# Patient Record
Sex: Male | Born: 1965 | Race: White | Hispanic: No | Smoking: Never smoker
Health system: Southern US, Community
[De-identification: ages and names within clinical notes are randomized; demographics above are authoritative.]

## PROBLEM LIST (undated history)

## (undated) DIAGNOSIS — E7800 Pure hypercholesterolemia, unspecified: Secondary | ICD-10-CM

## (undated) DIAGNOSIS — F200 Paranoid schizophrenia: Secondary | ICD-10-CM

## (undated) HISTORY — PX: TONSILLECTOMY: SUR1361

---

## 2004-04-19 ENCOUNTER — Emergency Department (HOSPITAL_COMMUNITY): Admission: EM | Admit: 2004-04-19 | Discharge: 2004-04-19 | Payer: Self-pay | Admitting: Family Medicine

## 2004-08-04 ENCOUNTER — Emergency Department (HOSPITAL_COMMUNITY): Admission: EM | Admit: 2004-08-04 | Discharge: 2004-08-04 | Payer: Self-pay | Admitting: Emergency Medicine

## 2005-08-10 ENCOUNTER — Ambulatory Visit (HOSPITAL_COMMUNITY): Admission: RE | Admit: 2005-08-10 | Discharge: 2005-08-10 | Payer: Self-pay | Admitting: Specialist

## 2007-07-10 ENCOUNTER — Ambulatory Visit: Payer: Self-pay | Admitting: Cardiology

## 2008-09-14 ENCOUNTER — Emergency Department (HOSPITAL_COMMUNITY): Admission: EM | Admit: 2008-09-14 | Discharge: 2008-09-14 | Payer: Self-pay | Admitting: Emergency Medicine

## 2009-11-04 ENCOUNTER — Ambulatory Visit (HOSPITAL_COMMUNITY): Admission: RE | Admit: 2009-11-04 | Discharge: 2009-11-04 | Payer: Self-pay | Admitting: Orthopaedic Surgery

## 2009-11-10 ENCOUNTER — Encounter: Admission: RE | Admit: 2009-11-10 | Discharge: 2010-02-08 | Payer: Self-pay | Admitting: Orthopaedic Surgery

## 2011-11-07 ENCOUNTER — Emergency Department (HOSPITAL_COMMUNITY)
Admission: EM | Admit: 2011-11-07 | Discharge: 2011-11-08 | Disposition: A | Discharge status: Home / Self Care | Payer: Self-pay | Attending: Emergency Medicine | Admitting: Emergency Medicine

## 2011-11-07 DIAGNOSIS — E7800 Pure hypercholesterolemia, unspecified: Secondary | ICD-10-CM | POA: Insufficient documentation

## 2011-11-07 DIAGNOSIS — M79609 Pain in unspecified limb: Secondary | ICD-10-CM | POA: Insufficient documentation

## 2011-11-07 DIAGNOSIS — M79606 Pain in leg, unspecified: Secondary | ICD-10-CM

## 2011-11-07 DIAGNOSIS — R52 Pain, unspecified: Secondary | ICD-10-CM

## 2011-11-07 HISTORY — DX: Pure hypercholesterolemia, unspecified: E78.00

## 2011-11-07 NOTE — ED Notes (Signed)
Left leg pain behind knee. Feels like circulation has stopped. Has sharp pains in groin area for years

## 2011-11-08 ENCOUNTER — Encounter (HOSPITAL_COMMUNITY): Payer: Self-pay | Admitting: *Deleted

## 2011-11-08 ENCOUNTER — Ambulatory Visit (HOSPITAL_COMMUNITY)
Admit: 2011-11-08 | Discharge: 2011-11-08 | Disposition: A | Discharge status: Home / Self Care | Payer: Self-pay | Attending: Emergency Medicine | Admitting: Emergency Medicine

## 2011-11-08 DIAGNOSIS — R52 Pain, unspecified: Secondary | ICD-10-CM | POA: Insufficient documentation

## 2011-11-08 DIAGNOSIS — M79609 Pain in unspecified limb: Secondary | ICD-10-CM | POA: Insufficient documentation

## 2011-11-08 MED ORDER — HYDROCODONE-ACETAMINOPHEN 5-325 MG PO TABS: 1.0000 | ORAL_TABLET | ORAL | Status: AC | PRN

## 2011-11-08 MED ORDER — HYDROCODONE-ACETAMINOPHEN 5-325 MG PO TABS
2.0000 | ORAL_TABLET | Freq: Once | ORAL | Status: AC
  Administered 2011-11-08: 2 via ORAL
  Filled 2011-11-08: qty 2

## 2011-11-08 MED ORDER — ENOXAPARIN SODIUM 150 MG/ML ~~LOC~~ SOLN
1.0000 mg/kg | Freq: Once | SUBCUTANEOUS | Status: AC
  Administered 2011-11-08: 125 mg via SUBCUTANEOUS
  Filled 2011-11-08: qty 1

## 2011-11-08 NOTE — ED Provider Notes (Signed)
11/08/11  09:48  Patient returned for Venous Img of the Left LE.  I have advised him that imaging result was negative for DVT.  Stated that he has appt with the health dept tomorrow.    Avishai Reihl L. [place], [place] 11/08/11 [phone]

## 2011-11-08 NOTE — ED Provider Notes (Addendum)
History     CSN: [id number]  Arrival date & time 11/07/11  2347   First MD Initiated Contact with Patient 11/08/11 0008      Chief Complaint  Patient presents with  . Leg Pain    (Consider location/radiation/quality/duration/timing/severity/associated sxs/prior treatment) HPI Malik Blake is a 46 y.o. male who presents to the Emergency Department complaining of pain to the calf of the left lower leg that extends to the knee which began yesterday. States the leg feels "full" and like it doesn't have any circulation. He also complains of sharp stabbing left groin pain that he has had off and on for years. He denies any recent injury, illness, extended travel, prolonged inactivity. He is taking no medicines.  No PCP  Past Medical History  Diagnosis Date  . High cholesterol     Past Surgical History  Procedure Date  . Tonsillectomy     History reviewed. No pertinent family history.  History  Substance Use Topics  . Smoking status: Never Smoker   . Smokeless tobacco: Not on file  . Alcohol Use: No      Review of Systems  Constitutional: Negative for fever.       10 Systems reviewed and are negative for acute change except as noted in the HPI.  HENT: Negative for congestion.   Eyes: Negative for discharge and redness.  Respiratory: Negative for cough and shortness of breath.   Cardiovascular: Negative for chest pain.  Gastrointestinal: Negative for vomiting and abdominal pain.  Musculoskeletal: Negative for back pain.       Left lower leg pain  Skin: Negative for rash.  Neurological: Negative for syncope, numbness and headaches.  Psychiatric/Behavioral:       No behavior change.    Allergies  Review of patient's allergies indicates no known allergies.  Home Medications   Current Outpatient Rx  Name Route Sig Dispense Refill  . BENZTROPINE MESYLATE 1 MG PO TABS Oral Take 1 mg by mouth 2 (two) times daily.    Marland Kitchen CLONAZEPAM 0.5 MG PO TABS Oral Take 0.5 mg by  mouth at bedtime as needed.    Marland Kitchen RISPERIDONE 3 MG PO TABS Oral Take 3 mg by mouth daily.      BP 131/83  Pulse 90  Temp(Src) 98.6 F (37 C) (Oral)  Resp 20  Ht 6\' 1"  (1.854 m)  Wt 270 lb (122.471 kg)  BMI 35.62 kg/m2  SpO2 95%  Physical Exam  Nursing note and vitals reviewed. Constitutional:       Awake, alert, nontoxic appearance.  HENT:  Head: Atraumatic.  Eyes: Right eye exhibits no discharge. Left eye exhibits no discharge.  Neck: Neck supple.  Pulmonary/Chest: Effort normal. He exhibits no tenderness.  Abdominal: Soft. There is no tenderness. There is no rebound.  Musculoskeletal: He exhibits no tenderness.       Baseline ROM, no obvious new focal weakness. There is no appreciable difference in the circumference of his left leg compared to his right leg. There is no tenderness to palpation of the left calf. Negative Homans sign. Leg is warm. Dorsalis pedis posterior tibialis and popliteal pulses are all palpable.  Neurological:       Mental status and motor strength appears baseline for patient and situation.  Skin: No rash noted.  Psychiatric: He has a normal mood and affect.    ED Course  Procedures (including critical care time)     MDM  Patient with pain to the left lower extremity. Physical  exam is unremarkable. Patient was given Lovenox and analgesic. He has been scheduled for ultrasound tomorrow morning at 9:30 AM to rule out DVT. Patient and his mother have been given instructions on followup for ultrasound.Pt stable in ED with no significant deterioration in condition.The patient appears reasonably screened and/or stabilized for discharge and I doubt any other medical condition or other [facility] requiring further screening, evaluation, or treatment in the ED at this time prior to discharge.  MDM Reviewed: nursing note and vitals           Nicoletta Dress. Colon Branch, MD 11/08/11 0113  Nicoletta Dress. Colon Branch, MD 11/09/11 2125

## 2011-11-08 NOTE — Discharge Instructions (Signed)
You have been scheduled for an ultrasound of your leg tomorrow morning at 9:30 AM to look for blood clots. Be in the radiology department at 9:15 AM.  Once her ultrasound is complete he will be brought to the ER waiting room where the physician on duty will review the results with you. Use the pain medicine as needed.

## 2011-11-09 NOTE — ED Provider Notes (Signed)
Medical screening examination/treatment/procedure(s) were conducted as a shared visit with non-physician practitioner(s) and myself.  I personally evaluated the patient during the encounter  Nicoletta Dress. Colon Branch, MD 11/09/11 2125

## 2013-08-19 ENCOUNTER — Emergency Department (HOSPITAL_COMMUNITY)
Admission: EM | Admit: 2013-08-19 | Discharge: 2013-08-20 | Disposition: A | Discharge status: Home / Self Care | Payer: Self-pay | Attending: Emergency Medicine | Admitting: Emergency Medicine

## 2013-08-19 ENCOUNTER — Encounter (HOSPITAL_COMMUNITY): Payer: Self-pay | Admitting: Emergency Medicine

## 2013-08-19 DIAGNOSIS — Z862 Personal history of diseases of the blood and blood-forming organs and certain disorders involving the immune mechanism: Secondary | ICD-10-CM | POA: Insufficient documentation

## 2013-08-19 DIAGNOSIS — R209 Unspecified disturbances of skin sensation: Secondary | ICD-10-CM | POA: Insufficient documentation

## 2013-08-19 DIAGNOSIS — Z8639 Personal history of other endocrine, nutritional and metabolic disease: Secondary | ICD-10-CM | POA: Insufficient documentation

## 2013-08-19 DIAGNOSIS — M549 Dorsalgia, unspecified: Secondary | ICD-10-CM

## 2013-08-19 DIAGNOSIS — Z79899 Other long term (current) drug therapy: Secondary | ICD-10-CM | POA: Insufficient documentation

## 2013-08-19 DIAGNOSIS — M546 Pain in thoracic spine: Secondary | ICD-10-CM | POA: Insufficient documentation

## 2013-08-19 DIAGNOSIS — M545 Low back pain, unspecified: Secondary | ICD-10-CM | POA: Insufficient documentation

## 2013-08-19 NOTE — ED Notes (Signed)
Pt c/o back pain with spasms since last night. Pt denies any injury.

## 2013-08-20 ENCOUNTER — Emergency Department (HOSPITAL_COMMUNITY): Payer: Self-pay

## 2013-08-20 MED ORDER — HYDROCODONE-ACETAMINOPHEN 5-325 MG PO TABS: 1.0000 | ORAL_TABLET | ORAL | Status: DC | PRN

## 2013-08-20 MED ORDER — METHOCARBAMOL 500 MG PO TABS: 500.0000 mg | ORAL_TABLET | Freq: Three times a day (TID) | ORAL | Status: DC

## 2013-08-20 MED ORDER — KETOROLAC TROMETHAMINE 10 MG PO TABS
10.0000 mg | ORAL_TABLET | Freq: Once | ORAL | Status: AC
  Administered 2013-08-20: 10 mg via ORAL
  Filled 2013-08-20: qty 1

## 2013-08-20 MED ORDER — ONDANSETRON HCL 4 MG PO TABS
4.0000 mg | ORAL_TABLET | Freq: Once | ORAL | Status: AC
  Administered 2013-08-20: 4 mg via ORAL
  Filled 2013-08-20: qty 1

## 2013-08-20 MED ORDER — METHOCARBAMOL 500 MG PO TABS
1000.0000 mg | ORAL_TABLET | Freq: Once | ORAL | Status: AC
  Administered 2013-08-20: 1000 mg via ORAL
  Filled 2013-08-20: qty 2

## 2013-08-20 MED ORDER — HYDROCODONE-ACETAMINOPHEN 5-325 MG PO TABS
2.0000 | ORAL_TABLET | Freq: Once | ORAL | Status: DC
  Filled 2013-08-20: qty 2

## 2013-08-20 NOTE — Discharge Instructions (Signed)
Your x-ray is negative for any pneumonia, mass, or extra fluid on the lung. I suspect the tingling sensation is related to the degenerative changes of your thoracic area spine. Please see your primary physician or call the orthopedist for additional evaluation of the pins and needle sensation your experiencing. Please use Robaxin, ibuprofen, and Norco.

## 2013-08-20 NOTE — ED Provider Notes (Signed)
CSN: [id number]     Arrival date & time 08/19/13  2017 History   First MD Initiated Contact with Patient 08/19/13 2324     Chief Complaint  Patient presents with  . Back Pain     (Consider location/radiation/quality/duration/timing/severity/associated sxs/prior Treatment) HPI Comments: Pt report not resting well last night. He notes sensation of pins and needles in the upper back area. He has cough and nasal congestion. The cough makes the pain worse.  Patient is a 48 y.o. male presenting with back pain. The history is provided by the patient.  Back Pain Location:  Thoracic spine and lumbar spine Quality:  Cramping Radiates to:  L shoulder and R shoulder Pain severity:  Moderate Pain is:  Same all the time Onset quality:  Sudden Duration:  3 days Timing:  Intermittent Progression:  Worsening Chronicity:  Recurrent Context: not falling, not occupational injury and not recent injury   Relieved by:  Nothing Worsened by:  Movement Associated symptoms: no abdominal pain, no chest pain and no dysuria   Risk factors: no recent surgery     Past Medical History  Diagnosis Date  . High cholesterol    Past Surgical History  Procedure Laterality Date  . Tonsillectomy     No family history on file. History  Substance Use Topics  . Smoking status: Never Smoker   . Smokeless tobacco: Not on file  . Alcohol Use: No    Review of Systems  Constitutional: Negative for activity change.       All ROS Neg except as noted in HPI  HENT: Negative for nosebleeds.   Eyes: Negative for photophobia and discharge.  Respiratory: Negative for cough, shortness of breath and wheezing.   Cardiovascular: Negative for chest pain and palpitations.  Gastrointestinal: Negative for abdominal pain and blood in stool.  Genitourinary: Negative for dysuria, frequency and hematuria.  Musculoskeletal: Positive for back pain. Negative for arthralgias and neck pain.  Skin: Negative.   Neurological: Negative  for dizziness, seizures and speech difficulty.  Psychiatric/Behavioral: Negative for hallucinations and confusion.      Allergies  Review of patient's allergies indicates no known allergies.  Home Medications   Current Outpatient Rx  Name  Route  Sig  Dispense  Refill  . benztropine (COGENTIN) 1 MG tablet   Oral   Take 1 mg by mouth 2 (two) times daily.         . clonazePAM (KLONOPIN) 0.5 MG tablet   Oral   Take 0.5 mg by mouth at bedtime as needed.         . risperiDONE (RISPERDAL) 3 MG tablet   Oral   Take 3 mg by mouth daily.          BP 111/72  Pulse 85  Temp(Src) 97.9 F (36.6 C) (Oral)  Resp 20  Ht 6\' 1"  (1.854 m)  Wt 280 lb (127.007 kg)  BMI 36.95 kg/m2  SpO2 97% Physical Exam  Nursing note and vitals reviewed. Constitutional: He is oriented to person, place, and time. He appears well-developed and well-nourished.  Non-toxic appearance.  HENT:  Head: Normocephalic.  Right Ear: Tympanic membrane and external ear normal.  Left Ear: Tympanic membrane and external ear normal.  Eyes: EOM and lids are normal. Pupils are equal, round, and reactive to light.  Neck: Normal range of motion. Neck supple. Carotid bruit is not present.  Cardiovascular: Normal rate, regular rhythm, normal heart sounds, intact distal pulses and normal pulses.   Pulmonary/Chest: No respiratory distress.  Few scattered rhonchi present.  Abdominal: Soft. Bowel sounds are normal. There is no tenderness. There is no guarding.  Musculoskeletal: Normal range of motion.  There is discomfort to palpation of the upper back. There is no palpable step off of the thoracic area or the lumbar area. There is paraspinal area tenderness of the lumbar region.  Lymphadenopathy:       Head (right side): No submandibular adenopathy present.       Head (left side): No submandibular adenopathy present.    He has no cervical adenopathy.  Neurological: He is alert and oriented to person, place, and time. He  has normal strength. No cranial nerve deficit or sensory deficit.  Skin: Skin is warm and dry.  Psychiatric: He has a normal mood and affect. His speech is normal.    ED Course  Procedures (including critical care time) Labs Review Labs Reviewed - No data to display Imaging Review No results found.   EKG Interpretation None      MDM Patient states he has a history of degenerative disc disease of the lumbar spine. He has pain from time to time in his upper back, but in the last 3-4 days he's been having sensation of pins and needles in the upper back and at times and both arms. He has not been dropping any objects. He's not had any dizziness, palpitations, lightheadedness. There's been no loss of consciousness reported. There's been no high fever. The patient does have a problem with cough. The pain is actually aggravated by the cough.  Chest x-ray is negative for acute issue. Suspect that the patient has degenerative changes involving the thoracic spine area. Patient has been given the results of his chest x-ray. The plan at this time is for the patient to be placed on Robaxin, Tylenol, and Norco. Patient strongly advised to see his physician as soon as possible.    Final diagnoses:  None    **I have reviewed nursing notes, vital signs, and all appropriate lab and imaging results for this patient.    Kathie Dike, PA-C 08/20/13 0116

## 2013-08-20 NOTE — ED Provider Notes (Signed)
Medical screening examination/treatment/procedure(s) were performed by non-physician practitioner and as supervising physician I was immediately available for consultation/collaboration.   EKG Interpretation None       Sunnie NielsenBrian Talha Iser, MD 08/20/13 [phone]

## 2015-03-23 ENCOUNTER — Other Ambulatory Visit: Payer: Self-pay | Admitting: Physician Assistant

## 2015-03-24 LAB — COMPREHENSIVE METABOLIC PANEL
ALBUMIN: 4.5 g/dL (ref 3.6–5.1)
ALT: 58 U/L — ABNORMAL HIGH (ref 9–46)
AST: 27 U/L (ref 10–40)
Alkaline Phosphatase: 62 U/L (ref 40–115)
BUN: 9 mg/dL (ref 7–25)
CALCIUM: 9.6 mg/dL (ref 8.6–10.3)
CHLORIDE: 102 mmol/L (ref 98–110)
CO2: 27 mmol/L (ref 20–31)
Creat: 0.98 mg/dL (ref 0.60–1.35)
Glucose, Bld: 91 mg/dL (ref 65–99)
POTASSIUM: 4.4 mmol/L (ref 3.5–5.3)
Sodium: 141 mmol/L (ref 135–146)
TOTAL PROTEIN: 6.9 g/dL (ref 6.1–8.1)
Total Bilirubin: 0.6 mg/dL (ref 0.2–1.2)

## 2015-03-24 LAB — LIPID PANEL
CHOL/HDL RATIO: 5.2 ratio — AB (ref <=5.0)
CHOLESTEROL: 151 mg/dL (ref 125–200)
HDL: 29 mg/dL — ABNORMAL LOW (ref >=40)
LDL Cholesterol: 72 mg/dL (ref <130)
TRIGLYCERIDES: 251 mg/dL — AB (ref <150)
VLDL: 50 mg/dL — AB (ref <30)

## 2015-03-26 ENCOUNTER — Ambulatory Visit: Payer: Self-pay | Admitting: Physician Assistant

## 2015-03-30 ENCOUNTER — Emergency Department (HOSPITAL_COMMUNITY): Payer: Self-pay

## 2015-03-30 ENCOUNTER — Emergency Department (HOSPITAL_COMMUNITY)
Admission: EM | Admit: 2015-03-30 | Discharge: 2015-03-30 | Disposition: A | Discharge status: Home / Self Care | Payer: Self-pay | Attending: Emergency Medicine | Admitting: Emergency Medicine

## 2015-03-30 ENCOUNTER — Encounter (HOSPITAL_COMMUNITY): Payer: Self-pay | Admitting: Emergency Medicine

## 2015-03-30 DIAGNOSIS — Y9389 Activity, other specified: Secondary | ICD-10-CM | POA: Insufficient documentation

## 2015-03-30 DIAGNOSIS — Y998 Other external cause status: Secondary | ICD-10-CM | POA: Insufficient documentation

## 2015-03-30 DIAGNOSIS — M5136 Other intervertebral disc degeneration, lumbar region: Secondary | ICD-10-CM | POA: Insufficient documentation

## 2015-03-30 DIAGNOSIS — G8929 Other chronic pain: Secondary | ICD-10-CM | POA: Insufficient documentation

## 2015-03-30 DIAGNOSIS — Z79899 Other long term (current) drug therapy: Secondary | ICD-10-CM | POA: Insufficient documentation

## 2015-03-30 DIAGNOSIS — Z8639 Personal history of other endocrine, nutritional and metabolic disease: Secondary | ICD-10-CM | POA: Insufficient documentation

## 2015-03-30 DIAGNOSIS — X58XXXA Exposure to other specified factors, initial encounter: Secondary | ICD-10-CM | POA: Insufficient documentation

## 2015-03-30 DIAGNOSIS — Y9289 Other specified places as the place of occurrence of the external cause: Secondary | ICD-10-CM | POA: Insufficient documentation

## 2015-03-30 MED ORDER — IBUPROFEN 800 MG PO TABS: 800.0000 mg | ORAL_TABLET | Freq: Three times a day (TID) | ORAL | Status: DC

## 2015-03-30 MED ORDER — KETOROLAC TROMETHAMINE 10 MG PO TABS
10.0000 mg | ORAL_TABLET | Freq: Once | ORAL | Status: AC
  Administered 2015-03-30: 10 mg via ORAL
  Filled 2015-03-30: qty 1

## 2015-03-30 MED ORDER — CYCLOBENZAPRINE HCL 10 MG PO TABS: 10.0000 mg | ORAL_TABLET | Freq: Three times a day (TID) | ORAL | Status: DC

## 2015-03-30 MED ORDER — DIAZEPAM 5 MG PO TABS
10.0000 mg | ORAL_TABLET | Freq: Once | ORAL | Status: AC
  Administered 2015-03-30: 10 mg via ORAL
  Filled 2015-03-30: qty 2

## 2015-03-30 MED ORDER — HYDROMORPHONE HCL 1 MG/ML IJ SOLN
1.0000 mg | Freq: Once | INTRAMUSCULAR | Status: AC
  Administered 2015-03-30: 1 mg via INTRAMUSCULAR
  Filled 2015-03-30: qty 1

## 2015-03-30 MED ORDER — DEXAMETHASONE 4 MG PO TABS: 4.0000 mg | ORAL_TABLET | Freq: Two times a day (BID) | ORAL | Status: DC

## 2015-03-30 MED ORDER — ONDANSETRON HCL 4 MG PO TABS
4.0000 mg | ORAL_TABLET | Freq: Once | ORAL | Status: AC
  Administered 2015-03-30: 4 mg via ORAL
  Filled 2015-03-30: qty 1

## 2015-03-30 NOTE — ED Notes (Signed)
History of back pain for nine years but on Sunday morning while tieing shoe, back popped.  Rates pain 10/10.

## 2015-03-30 NOTE — Discharge Instructions (Signed)
Your CT scan suggests a bulging disc at the L4-L5 level. Please apply heat to your lower back. Please rest your back is much as possible. Please see Dr. Victorino Dike or member of his team as soon as possible for orthopedic evaluation concerning your back and your back pain. Please use medications as prescribed. Flexeril may cause drowsiness, please use this medication with caution. Degenerative Disk Disease Degenerative disk disease is a condition caused by the changes that occur in spinal disks as you grow older. Spinal disks are soft and compressible disks located between the bones of your spine (vertebrae). These disks act like shock absorbers. Degenerative disk disease can affect the whole spine. However, the neck and lower back are most commonly affected. Many changes can occur in the spinal disks with aging, such as:  The spinal disks may dry and shrink.  Small tears may occur in the tough, outer covering of the disk (annulus).  The disk space may become smaller due to loss of water.  Abnormal growths in the bone (spurs) may occur. This can put pressure on the nerve roots exiting the spinal canal, causing pain.  The spinal canal may become narrowed. RISK FACTORS   Being overweight.  Having a family history of degenerative disk disease.  Smoking.  There is increased risk if you are doing heavy lifting or have a sudden injury. SIGNS AND SYMPTOMS  Symptoms vary from person to person and may include:  Pain that varies in intensity. Some people have no pain, while others have severe pain. The location of the pain depends on the part of your backbone that is affected.  You will have neck or arm pain if a disk in the neck area is affected.  You will have pain in your back, buttocks, or legs if a disk in the lower back is affected.  Pain that becomes worse while bending, reaching up, or with twisting movements.  Pain that may start gradually and then get worse as time passes. It may also  start after a major or minor injury.  Numbness or tingling in the arms or legs. DIAGNOSIS  Your health care provider will ask you about your symptoms and about activities or habits that may cause the pain. He or she may also ask about any injuries, diseases, or treatments you have had. Your health care provider will examine you to check for the range of movement that is possible in the affected area, to check for strength in your extremities, and to check for sensation in the areas of the arms and legs supplied by different nerve roots. You may also have:   An X-ray of the spine.  Other imaging tests, such as MRI. TREATMENT  Your health care provider will advise you on the best plan for treatment. Treatment may include:  Medicines.  Rehabilitation exercises. HOME CARE INSTRUCTIONS   Follow proper lifting and walking techniques as advised by your health care provider.  Maintain good posture.  Exercise regularly as advised by your health care provider.  Perform relaxation exercises.  Change your sitting, standing, and sleeping habits as advised by your health care provider.  Change positions frequently.  Lose weight or maintain a healthy weight as advised by your health care provider.  Do not use any tobacco products, including cigarettes, chewing tobacco, or electronic cigarettes. If you need help quitting, ask your health care provider.  Wear supportive footwear.  Take medicines only as directed by your health care provider. SEEK MEDICAL CARE IF:  Your pain does not go away within 1-4 weeks.  You have significant appetite or weight loss. SEEK IMMEDIATE MEDICAL CARE IF:   Your pain is severe.  You notice weakness in your arms, hands, or legs.  You begin to lose control of your bladder or bowel movements.  You have fevers or night sweats. MAKE SURE YOU:   Understand these instructions.  Will watch your condition.  Will get help right away if you are not doing  well or get worse.   This information is not intended to replace advice given to you by your health care provider. Make sure you discuss any questions you have with your health care provider.   Document Released: 03/13/2007 Document Revised: 06/06/2014 Document Reviewed: 09/17/2013 Elsevier Interactive Patient Education [employer]2016 [employer]

## 2015-03-30 NOTE — ED Provider Notes (Signed)
CSN: [id number]     Arrival date & time 03/30/15  1243 History  By signing my name below, I, Gwenyth Ober, attest that this documentation has been prepared under the direction and in the presence of Ivery Quale, PA-C  Electronically Signed: Gwenyth Ober, ED Scribe. 03/30/2015. 2:19 PM.   Chief Complaint  Patient presents with  . Back Pain   Patient is a 49 y.o. male presenting with back pain. The history is provided by the patient. No language interpreter was used.  Back Pain Location:  Lumbar spine Quality:  Aching Radiates to:  Does not radiate Pain severity:  Moderate Pain is:  Same all the time Onset quality:  Sudden Duration:  1 day Timing:  Constant Chronicity:  Chronic Context: recent injury   Relieved by:  Being still Worsened by:  Movement Associated symptoms: no bladder incontinence and no bowel incontinence     HPI Comments: Malik Blake is a 49 y.o. male with a history of chronic back pain associated with L4/L5/S1 who presents to the Emergency Department complaining of a constant, moderate acute exacerbation of chronic lower back pain that started yesterday while bending to tie his shoes. Pt states that he was immobilized in his chair for at least 30 minutes after the injury secondary to pain. His pain improves with being still, but worsens with moving. Pt reports initial back pain started 9 years ago while he was working [employer]. He was evaluated for the back pain several years ago and had an MRI that showed bulging of L4/L5/SI. Pt denies previous back surgeries. He also denies bladder or bowel incontinence.  Past Medical History  Diagnosis Date  . High cholesterol    Past Surgical History  Procedure Laterality Date  . Tonsillectomy     History reviewed. No pertinent family history. Social History  Substance Use Topics  . Smoking status: Never Smoker   . Smokeless tobacco: None  . Alcohol Use: No    Review of Systems  Gastrointestinal: Negative  for bowel incontinence.  Genitourinary: Negative for bladder incontinence.  Musculoskeletal: Positive for back pain.  All other systems reviewed and are negative.  Allergies  Review of patient's allergies indicates no known allergies.  Home Medications   Prior to Admission medications   Medication Sig Start Date End Date Taking? Authorizing Provider  benztropine (COGENTIN) 1 MG tablet Take 1 mg by mouth 2 (two) times daily.   Yes Historical Provider, MD  HALOPERIDOL PO Take by mouth.   Yes Historical Provider, MD  HYDROcodone-acetaminophen (NORCO/VICODIN) 5-325 MG per tablet Take 1 tablet by mouth every 4 (four) hours as needed for moderate pain. 08/20/13  Yes Ivery Quale, PA-C  Omega-3 Fatty Acids (FISH OIL PO) Take 1 capsule by mouth daily.   Yes Historical Provider, MD   BP 110/70 mmHg  Pulse 95  Temp(Src) 98.1 F (36.7 C) (Oral)  Resp 18  Ht  (1.854 m)  Wt 280 lb (127.007 kg)  BMI 36.95 kg/m2  SpO2 99% Physical Exam  Constitutional: He appears well-developed and well-nourished. No distress.  HENT:  Head: Normocephalic and atraumatic.  Eyes: Conjunctivae and EOM are normal.  Neck: Neck supple. No tracheal deviation present.  Cardiovascular: Normal rate, regular rhythm and normal heart sounds.   Pulmonary/Chest: Effort normal and breath sounds normal. No respiratory distress. He has no wheezes.  Musculoskeletal:  Dorsiflexion is equal bilaterally Pt will not allow examination of the lower back because of pain Pt will not participate in straight leg raise  because of pain  Neurological:  No sensory deficits of the lower extremity Pt not cooperative for full motor exam because of pain  Skin: Skin is warm and dry.  Psychiatric: He has a normal mood and affect. His behavior is normal.  Nursing note and vitals reviewed.   ED Course  Procedures   DIAGNOSTIC STUDIES: Oxygen Saturation is 99% on RA, normal by my interpretation.    COORDINATION OF CARE: 2:21 PM  Discussed treatment plan with pt which includes CT Lumbar Spine. Pt agreed to plan.   Labs Review Labs Reviewed - No data to display  Imaging Review Dg Lumbar Spine Complete  03/30/2015  CLINICAL DATA:  Injury.  Pain . EXAM: LUMBAR SPINE - COMPLETE 4+ VIEW COMPARISON:  None. FINDINGS: Paraspinal soft tissues are normal. No acute bony abnormality identified. IMPRESSION: No acute or focal abnormality. Electronically Signed   By: Maisie Fushomas  Register   On: 03/30/2015 14:17   I have personally reviewed and evaluated these images as part of my medical decision-making.   EKG Interpretation None      MDM Patient having episodes of yelling out in pain, intramuscular pain medication will be given to the patient at this time. Pain improved with intramuscular pain medication and muscle relaxers, but not resolved.  CT scan of the lumbar spine reveals mild broad-based disc bulge at the L4-L5 area no foraminal stenosis appreciated.  I discussed the findings with the patient in terms which he understands. I've asked the patient to see Dr. Victorino DikeHewitt, or the orthopedic specialist of his choice for additional evaluation and management of his back pain.    Final diagnoses:  None    **I have reviewed nursing notes, vital signs, and all appropriate lab and imaging results for this patient.*  **I personally performed the services described in this documentation, which was scribed in my presence. The recorded information has been reviewed and is accurate.Ivery Quale*    Cindi Ghazarian, PA-C 03/30/15 1543  Samuel JesterKathleen McManus, DO 04/01/15 1432

## 2015-04-01 ENCOUNTER — Ambulatory Visit: Payer: Self-pay | Admitting: Physician Assistant

## 2015-04-07 ENCOUNTER — Ambulatory Visit: Payer: Self-pay | Admitting: Physician Assistant

## 2015-04-07 ENCOUNTER — Encounter: Payer: Self-pay | Admitting: Physician Assistant

## 2015-04-07 VITALS — BP 110/68 | HR 124 | Temp 98.1°F | Ht 72.5 in | Wt 278.6 lb

## 2015-04-07 DIAGNOSIS — E785 Hyperlipidemia, unspecified: Secondary | ICD-10-CM | POA: Insufficient documentation

## 2015-04-07 NOTE — Progress Notes (Signed)
BP 110/68 mmHg  Pulse 124  Temp(Src) 98.1 F (36.7 C)  Ht 6' 0.5" (1.842 m)  Wt 278 lb 9.6 oz (126.372 kg)  BMI 37.25 kg/m2  SpO2 96%   Subjective:    Patient ID: Malik Blake, male    DOB: 08-13-1965, 49 y.o.   MRN: [id number]  HPI: Malik FreezeBarry K Blake is a 49 y.o. male presenting on 04/07/2015 for Hyperlipidemia   HPI  Pt [facility] dr Hardie LoraBeene for evaluation of his back pain  Relevant past medical, surgical, family and social history reviewed and updated as indicated. Interim medical history since our last visit reviewed. Allergies and medications reviewed and updated.  Current outpatient prescriptions:  .  benztropine (COGENTIN) 1 MG tablet, Take 1 mg by mouth 2 (two) times daily., Disp: , Rfl:  .  cyclobenzaprine (FLEXERIL) 10 MG tablet, Take 1 tablet (10 mg total) by mouth 3 (three) times daily., Disp: 20 tablet, Rfl: 0 .  HALOPERIDOL PO, Take 3 mg by mouth at bedtime. Take one and one half tablets by mouth at bedtime, Disp: , Rfl:  .  ibuprofen (ADVIL,MOTRIN) 800 MG tablet, Take 1 tablet (800 mg total) by mouth 3 (three) times daily., Disp: 21 tablet, Rfl: 0 .  Omega-3 Fatty Acids (FISH OIL PO), Take 1 capsule by mouth daily., Disp: , Rfl:  .  simvastatin (ZOCOR) 20 MG tablet, Take 20 mg by mouth at bedtime. , Disp: , Rfl:    Review of Systems  Constitutional: Negative for fever, chills, diaphoresis, appetite change, fatigue and unexpected weight change.  HENT: Positive for dental problem and ear pain. Negative for congestion, drooling, facial swelling, hearing loss, mouth sores, sneezing, sore throat, trouble swallowing and voice change.   Eyes: Negative for pain, discharge, redness, itching and visual disturbance.  Respiratory: Positive for shortness of breath. Negative for cough, choking and wheezing.   Cardiovascular: Negative for chest pain, palpitations and leg swelling.  Gastrointestinal: Positive for constipation. Negative  for vomiting, abdominal pain, diarrhea and blood in stool.  Endocrine: Positive for polydipsia. Negative for cold intolerance and heat intolerance.  Genitourinary: Negative for dysuria, hematuria and decreased urine volume.  Musculoskeletal: Positive for back pain, arthralgias and gait problem.  Skin: Negative for rash.  Allergic/Immunologic: Negative for environmental allergies.  Neurological: Negative for seizures, syncope, light-headedness and headaches.  Hematological: Negative for adenopathy.  Psychiatric/Behavioral: Negative for suicidal ideas, dysphoric mood and agitation. The patient is not nervous/anxious.     Per HPI unless specifically indicated above     Objective:    BP 110/68 mmHg  Pulse 124  Temp(Src) 98.1 F (36.7 C)  Ht 6' 0.5" (1.842 m)  Wt 278 lb 9.6 oz (126.372 kg)  BMI 37.25 kg/m2  SpO2 96%  Wt Readings from Last 3 Encounters:  04/07/15 278 lb 9.6 oz (126.372 kg)  03/30/15 280 lb (127.007 kg)  08/19/13 280 lb (127.007 kg)    Physical Exam  Constitutional: He is oriented to person, place, and time. He appears well-developed and well-nourished.  HENT:  Head: Normocephalic and atraumatic.  Neck: Neck supple.  Cardiovascular: Normal rate and regular rhythm.   Pulmonary/Chest: Effort normal and breath sounds normal. He has no wheezes.  Abdominal:  Deferred abd exam due to back pain  Musculoskeletal: He exhibits no edema.  Lymphadenopathy:    He has no cervical adenopathy.  Neurological: He is alert and oriented to person, place, and time.  Skin: Skin is warm  and dry.  Psychiatric: He has a normal mood and affect. His behavior is normal. Thought content normal.  Vitals reviewed.   Results for orders placed or performed in visit on 03/23/15  Comprehensive metabolic panel  Result Value Ref Range   Sodium 141 135 - 146 mmol/L   Potassium 4.4 3.5 - 5.3 mmol/L   Chloride 102 98 - 110 mmol/L   CO2 27 20 - 31 mmol/L   Glucose, Bld 91 65 - 99 mg/dL   BUN  9 7 - 25 mg/dL   Creat 1.61 0.96 - 0.45 mg/dL   Total Bilirubin 0.6 0.2 - 1.2 mg/dL   Alkaline Phosphatase 62 40 - 115 U/L   AST 27 10 - 40 U/L   ALT 58 (H) 9 - 46 U/L   Total Protein 6.9 6.1 - 8.1 g/dL   Albumin 4.5 3.6 - 5.1 g/dL   Calcium 9.6 8.6 - 40.9 mg/dL  Lipid panel  Result Value Ref Range   Cholesterol 151 125 - 200 mg/dL   Triglycerides 811 (H) <150 mg/dL   HDL 29 (L) >=91 mg/dL   Total CHOL/HDL Ratio 5.2 (H) <=5.0 Ratio   VLDL 50 (H) <30 mg/dL   LDL Cholesterol 72 <478 mg/dL      Assessment & Plan:   Encounter Diagnosis  Name Primary?  . Hyperlipemia Yes     Reviewed labs with pt  Cont simvastatin. Increase fish oil to 4 daily. Cont lowfat diet F/u 3 mo. rto sooner prn

## 2015-07-08 ENCOUNTER — Ambulatory Visit: Payer: Self-pay | Admitting: Physician Assistant

## 2015-07-08 ENCOUNTER — Encounter: Payer: Self-pay | Admitting: Physician Assistant

## 2015-07-08 VITALS — BP 130/82 | HR 105 | Temp 98.2°F | Ht 72.5 in | Wt 284.0 lb

## 2015-07-08 DIAGNOSIS — G8929 Other chronic pain: Secondary | ICD-10-CM

## 2015-07-08 DIAGNOSIS — E669 Obesity, unspecified: Secondary | ICD-10-CM

## 2015-07-08 DIAGNOSIS — E785 Hyperlipidemia, unspecified: Secondary | ICD-10-CM

## 2015-07-08 DIAGNOSIS — M549 Dorsalgia, unspecified: Secondary | ICD-10-CM

## 2015-07-08 NOTE — Progress Notes (Signed)
   BP 130/82 mmHg  Pulse 105  Temp(Src) 98.2 F (36.8 C)  Ht 6' 0.5" (1.842 m)  Wt 284 lb (128.822 kg)  BMI 37.97 kg/m2  SpO2 95%   Subjective:    Patient ID: Malik Blake, Malik Blake    DOB: May 14, 1966, 50 y.o.   MRN: [id number]  HPI: Malik Blake is a 50 y.o. Malik Blake presenting on 07/08/2015 for Hyperlipidemia   HPI  -Pt states doing well.  -Pt states he is still going to [facility]   Relevant past medical, surgical, family and social history reviewed and updated as indicated. Interim medical history since our last visit reviewed. Allergies and medications reviewed and updated.  Review of Systems  Constitutional: Negative for fever, chills, diaphoresis, appetite change, fatigue and unexpected weight change.  HENT: Negative for congestion, dental problem, drooling, ear pain, facial swelling, hearing loss, mouth sores, sneezing, sore throat, trouble swallowing and voice change.   Eyes: Negative for pain, discharge, redness, itching and visual disturbance.  Respiratory: Negative for cough, choking, shortness of breath and wheezing.   Cardiovascular: Negative for chest pain, palpitations and leg swelling.  Gastrointestinal: Negative for vomiting, abdominal pain, diarrhea, constipation and blood in stool.  Endocrine: Negative for cold intolerance, heat intolerance and polydipsia.  Genitourinary: Negative for dysuria, hematuria and decreased urine volume.  Musculoskeletal: Positive for back pain and arthralgias. Negative for gait problem.  Skin: Negative for rash.  Allergic/Immunologic: Positive for environmental allergies.  Neurological: Negative for seizures, syncope, light-headedness and headaches.  Hematological: Negative for adenopathy.  Psychiatric/Behavioral: Negative for suicidal ideas, dysphoric mood and agitation. The patient is nervous/anxious.     Per HPI unless specifically indicated above     Objective:    BP 130/82 mmHg  Pulse 105  Temp(Src) 98.2 F (36.8 C)  Ht 6'  0.5" (1.842 m)  Wt 284 lb (128.822 kg)  BMI 37.97 kg/m2  SpO2 95%  Wt Readings from Last 3 Encounters:  07/08/15 284 lb (128.822 kg)  04/07/15 278 lb 9.6 oz (126.372 kg)  03/30/15 280 lb (127.007 kg)    Physical Exam  Constitutional: He is oriented to person, place, and time. He appears well-developed and well-nourished.  HENT:  Head: Normocephalic and atraumatic.  Neck: Neck supple.  Cardiovascular: Normal rate and regular rhythm.   Pulmonary/Chest: Effort normal and breath sounds normal. He has no wheezes.  Abdominal: Soft. Bowel sounds are normal. There is no hepatosplenomegaly. There is no tenderness.  Musculoskeletal: He exhibits no edema.  Lymphadenopathy:    He has no cervical adenopathy.  Neurological: He is alert and oriented to person, place, and time.  Skin: Skin is warm and dry.  Psychiatric: He has a normal mood and affect. His behavior is normal.  Vitals reviewed.       Assessment & Plan:   Encounter Diagnoses  Name Primary?  . Hyperlipidemia Yes  . Obesity, unspecified   . Chronic back pain    -Check fasting labs tomorrow- will call with results -F/u 6 mo.  RTO sooner prn

## 2015-07-09 LAB — COMPLETE METABOLIC PANEL WITH GFR
ALBUMIN: 4.5 g/dL (ref 3.6–5.1)
ALK PHOS: 61 U/L (ref 40–115)
ALT: 69 U/L — ABNORMAL HIGH (ref 9–46)
AST: 35 U/L (ref 10–40)
BILIRUBIN TOTAL: 0.6 mg/dL (ref 0.2–1.2)
BUN: 9 mg/dL (ref 7–25)
CO2: 28 mmol/L (ref 20–31)
CREATININE: 1.06 mg/dL (ref 0.60–1.35)
Calcium: 9.9 mg/dL (ref 8.6–10.3)
Chloride: 103 mmol/L (ref 98–110)
GFR, EST NON AFRICAN AMERICAN: 83 mL/min (ref >=60)
GFR, Est African American: >89 mL/min (ref >=60)
GLUCOSE: 102 mg/dL — AB (ref 65–99)
Potassium: 4.1 mmol/L (ref 3.5–5.3)
SODIUM: 141 mmol/L (ref 135–146)
Total Protein: 6.9 g/dL (ref 6.1–8.1)

## 2015-07-09 LAB — LIPID PANEL
Cholesterol: 176 mg/dL (ref 125–200)
HDL: 33 mg/dL — AB (ref >=40)
LDL Cholesterol: 106 mg/dL (ref <130)
Total CHOL/HDL Ratio: 5.3 Ratio — ABNORMAL HIGH (ref <=5.0)
Triglycerides: 183 mg/dL — ABNORMAL HIGH (ref <150)
VLDL: 37 mg/dL — ABNORMAL HIGH (ref <30)

## 2015-08-04 ENCOUNTER — Other Ambulatory Visit: Payer: Self-pay | Admitting: Physician Assistant

## 2015-08-04 MED ORDER — SIMVASTATIN 20 MG PO TABS: 20.0000 mg | ORAL_TABLET | Freq: Every day | ORAL | Status: DC

## 2015-12-29 ENCOUNTER — Other Ambulatory Visit: Payer: Self-pay

## 2015-12-29 DIAGNOSIS — E785 Hyperlipidemia, unspecified: Secondary | ICD-10-CM

## 2015-12-29 LAB — COMPLETE METABOLIC PANEL WITH GFR
ALBUMIN: 4.6 g/dL (ref 3.6–5.1)
ALK PHOS: 62 U/L (ref 40–115)
ALT: 56 U/L — AB (ref 9–46)
AST: 30 U/L (ref 10–40)
BILIRUBIN TOTAL: 0.6 mg/dL (ref 0.2–1.2)
BUN: 6 mg/dL — AB (ref 7–25)
CO2: 27 mmol/L (ref 20–31)
CREATININE: 1 mg/dL (ref 0.60–1.35)
Calcium: 10 mg/dL (ref 8.6–10.3)
Chloride: 104 mmol/L (ref 98–110)
GFR, Est African American: >89 mL/min (ref >=60)
GFR, Est Non African American: 88 mL/min (ref >=60)
GLUCOSE: 95 mg/dL (ref 65–99)
Potassium: 4.2 mmol/L (ref 3.5–5.3)
SODIUM: 141 mmol/L (ref 135–146)
TOTAL PROTEIN: 6.9 g/dL (ref 6.1–8.1)

## 2015-12-29 LAB — LIPID PANEL
Cholesterol: 161 mg/dL (ref 125–200)
HDL: 35 mg/dL — AB (ref >=40)
LDL CALC: 92 mg/dL (ref <130)
Total CHOL/HDL Ratio: 4.6 Ratio (ref <=5.0)
Triglycerides: 168 mg/dL — ABNORMAL HIGH (ref <150)
VLDL: 34 mg/dL — ABNORMAL HIGH (ref <30)

## 2016-01-05 ENCOUNTER — Ambulatory Visit: Payer: Self-pay | Admitting: Physician Assistant

## 2016-01-05 ENCOUNTER — Encounter: Payer: Self-pay | Admitting: Physician Assistant

## 2016-01-05 VITALS — BP 110/64 | HR 87 | Temp 98.1°F | Ht 72.5 in | Wt 289.5 lb

## 2016-01-05 DIAGNOSIS — E669 Obesity, unspecified: Secondary | ICD-10-CM

## 2016-01-05 DIAGNOSIS — M549 Dorsalgia, unspecified: Secondary | ICD-10-CM

## 2016-01-05 DIAGNOSIS — E785 Hyperlipidemia, unspecified: Secondary | ICD-10-CM

## 2016-01-05 DIAGNOSIS — G8929 Other chronic pain: Secondary | ICD-10-CM

## 2016-01-05 MED ORDER — SIMVASTATIN 20 MG PO TABS: 20.0000 mg | ORAL_TABLET | Freq: Every day | ORAL | 4 refills | Status: DC

## 2016-01-05 NOTE — Patient Instructions (Signed)

## 2016-01-05 NOTE — Progress Notes (Signed)
BP 110/64 (BP Location: Left Arm, Patient Position: Sitting, Cuff Size: Large)   Pulse 87   Temp 98.1 F (36.7 C)   Ht 6' 0.5" (1.842 m)   Wt 289 lb 8 oz (131.3 kg)   SpO2 98%   BMI 38.72 kg/m    Subjective:    Patient ID: Malik Blake Surita, male    DOB: [birth year], 50 y.o.   MRN: [id number]  HPI: Malik Blake Noguera is a 50 y.o. male presenting on 01/05/2016 for Hyperlipidemia   HPI   [facility].   He is doing well with no new issues  Relevant past medical, surgical, family and social history reviewed and updated as indicated. Interim medical history since our last visit reviewed. Allergies and medications reviewed and updated.   Current Outpatient Prescriptions:  .  benztropine (COGENTIN) 1 MG tablet, Take 1 mg by mouth 2 (two) times daily., Disp: , Rfl:  .  HALOPERIDOL PO, Take 3 mg by mouth at bedtime. Take one and one half tablets by mouth at bedtime, Disp: , Rfl:  .  Omega-3 Fatty Acids (FISH OIL PO), Take 3,000 mg by mouth daily. , Disp: , Rfl:  .  simvastatin (ZOCOR) 20 MG tablet, Take 1 tablet (20 mg total) by mouth at bedtime., Disp: 30 tablet, Rfl: 4   Review of Systems  Constitutional: Positive for unexpected weight change. Negative for appetite change, chills, diaphoresis, fatigue and fever.  HENT: Negative for congestion, drooling, ear pain, facial swelling, hearing loss, mouth sores, sneezing, sore throat, trouble swallowing and voice change.   Eyes: Positive for itching. Negative for pain, discharge, redness and visual disturbance.  Respiratory: Positive for shortness of breath. Negative for cough, choking and wheezing.   Cardiovascular: Negative for chest pain, palpitations and leg swelling.  Gastrointestinal: Negative for abdominal pain, blood in stool, constipation, diarrhea and vomiting.  Endocrine: Positive for polydipsia. Negative for cold intolerance and heat intolerance.  Genitourinary: Negative for decreased urine volume, dysuria and  hematuria.  Musculoskeletal: Positive for arthralgias and back pain. Negative for gait problem.  Skin: Negative for rash.  Allergic/Immunologic: Positive for environmental allergies.  Neurological: Negative for seizures, syncope, light-headedness and headaches.  Hematological: Negative for adenopathy.  Psychiatric/Behavioral: Negative for agitation, dysphoric mood and suicidal ideas. The patient is nervous/anxious.     Per HPI unless specifically indicated above     Objective:    BP 110/64 (BP Location: Left Arm, Patient Position: Sitting, Cuff Size: Large)   Pulse 87   Temp 98.1 F (36.7 C)   Ht 6' 0.5" (1.842 m)   Wt 289 lb 8 oz (131.3 kg)   SpO2 98%   BMI 38.72 kg/m   Wt Readings from Last 3 Encounters:  01/05/16 289 lb 8 oz (131.3 kg)  07/08/15 284 lb (128.8 kg)  04/07/15 278 lb 9.6 oz (126.4 kg)    Physical Exam  Constitutional: He is oriented to person, place, and time. He appears well-developed and well-nourished.  HENT:  Head: Normocephalic and atraumatic.  Neck: Neck supple.  Cardiovascular: Normal rate and regular rhythm.   Pulmonary/Chest: Effort normal and breath sounds normal. He has no wheezes.  Abdominal: Soft. Bowel sounds are normal. There is no hepatosplenomegaly. There is no tenderness.  Musculoskeletal: He exhibits no edema.  Lymphadenopathy:    He has no cervical adenopathy.  Neurological: He is alert and oriented to person, place, and time.  Skin: Skin is warm and dry.  Psychiatric: He has a  normal mood and affect. His behavior is normal.  Vitals reviewed.   Results for orders placed or performed in visit on 12/29/15  COMPLETE METABOLIC PANEL WITH GFR  Result Value Ref Range   Sodium 141 135 - 146 mmol/L   Potassium 4.2 3.5 - 5.3 mmol/L   Chloride 104 98 - 110 mmol/L   CO2 27 20 - 31 mmol/L   Glucose, Bld 95 65 - 99 mg/dL   BUN 6 (L) 7 - 25 mg/dL   Creat 1.61 0.96 - 0.45 mg/dL   Total Bilirubin 0.6 0.2 - 1.2 mg/dL   Alkaline Phosphatase 62  40 - 115 U/L   AST 30 10 - 40 U/L   ALT 56 (H) 9 - 46 U/L   Total Protein 6.9 6.1 - 8.1 g/dL   Albumin 4.6 3.6 - 5.1 g/dL   Calcium 40.9 8.6 - 81.1 mg/dL   GFR, Est African American >89 >=60 mL/min   GFR, Est Non African American 88 >=60 mL/min  Lipid Profile  Result Value Ref Range   Cholesterol 161 125 - 200 mg/dL   Triglycerides 914 (H) <150 mg/dL   HDL 35 (L) >=78 mg/dL   Total CHOL/HDL Ratio 4.6 <=5.0 Ratio   VLDL 34 (H) <30 mg/dL   LDL Cholesterol 92 <295 mg/dL      Assessment & Plan:    Encounter Diagnoses  Name Primary?  . Hyperlipidemia Yes  . Obesity, unspecified   . Chronic back pain     -reviewed labs with pt -continue current medications -counseled pt on gradually increasing activity to help chronic back pain -continue with [facility] for MH issues -f/u 6 months.  RTO sooner prn

## 2016-06-06 ENCOUNTER — Other Ambulatory Visit: Payer: Self-pay | Admitting: Physician Assistant

## 2016-06-27 ENCOUNTER — Other Ambulatory Visit: Payer: Self-pay

## 2016-06-27 DIAGNOSIS — E785 Hyperlipidemia, unspecified: Secondary | ICD-10-CM

## 2016-06-29 LAB — COMPLETE METABOLIC PANEL WITH GFR
ALBUMIN: 4.4 g/dL (ref 3.6–5.1)
ALK PHOS: 62 U/L (ref 40–115)
ALT: 65 U/L — ABNORMAL HIGH (ref 9–46)
AST: 33 U/L (ref 10–40)
BUN: 8 mg/dL (ref 7–25)
CHLORIDE: 104 mmol/L (ref 98–110)
CO2: 29 mmol/L (ref 20–31)
Calcium: 9.7 mg/dL (ref 8.6–10.3)
Creat: 1.07 mg/dL (ref 0.60–1.35)
GFR, Est African American: >89 mL/min (ref >=60)
GFR, Est Non African American: 81 mL/min (ref >=60)
GLUCOSE: 107 mg/dL — AB (ref 65–99)
POTASSIUM: 4.4 mmol/L (ref 3.5–5.3)
SODIUM: 142 mmol/L (ref 135–146)
Total Bilirubin: 0.5 mg/dL (ref 0.2–1.2)
Total Protein: 7 g/dL (ref 6.1–8.1)

## 2016-06-29 LAB — LIPID PANEL
CHOL/HDL RATIO: 5.7 ratio — AB (ref <5.0)
CHOLESTEROL: 176 mg/dL (ref <200)
HDL: 31 mg/dL — AB (ref >40)
LDL Cholesterol: 99 mg/dL (ref <100)
Triglycerides: 230 mg/dL — ABNORMAL HIGH (ref <150)
VLDL: 46 mg/dL — ABNORMAL HIGH (ref <30)

## 2016-07-06 ENCOUNTER — Encounter: Payer: Self-pay | Admitting: Physician Assistant

## 2016-07-06 ENCOUNTER — Ambulatory Visit: Payer: Self-pay | Admitting: Physician Assistant

## 2016-07-06 VITALS — BP 112/74 | HR 85 | Temp 97.9°F | Ht 72.5 in | Wt 293.0 lb

## 2016-07-06 DIAGNOSIS — M549 Dorsalgia, unspecified: Secondary | ICD-10-CM

## 2016-07-06 DIAGNOSIS — E785 Hyperlipidemia, unspecified: Secondary | ICD-10-CM

## 2016-07-06 DIAGNOSIS — G8929 Other chronic pain: Secondary | ICD-10-CM

## 2016-07-06 DIAGNOSIS — E669 Obesity, unspecified: Secondary | ICD-10-CM

## 2016-07-06 DIAGNOSIS — Z6839 Body mass index (BMI) 39.0-39.9, adult: Secondary | ICD-10-CM

## 2016-07-06 DIAGNOSIS — Z125 Encounter for screening for malignant neoplasm of prostate: Secondary | ICD-10-CM

## 2016-07-06 DIAGNOSIS — F39 Unspecified mood [affective] disorder: Secondary | ICD-10-CM

## 2016-07-06 NOTE — Progress Notes (Signed)
BP 112/74 (BP Location: Left Arm, Patient Position: Sitting, Cuff Size: Large)   Pulse 85   Temp 97.9 F (36.6 C) (Other (Comment))   Ht 6' 0.5" (1.842 m)   Wt 293 lb (132.9 kg)   SpO2 97%   BMI 39.19 kg/m    Subjective:    Patient ID: Malik Blake, male    DOB: Oct 05, 1965, 51 y.o.   MRN: [id number]  HPI: Malik Blake is a 51 y.o. male presenting on 07/06/2016 for Hyperlipidemia   HPI   Pt still going to [facility].  States doing okay. No new problems  Relevant past medical, surgical, family and social history reviewed and updated as indicated. Interim medical history since our last visit reviewed. Allergies and medications reviewed and updated.   Current Outpatient Prescriptions:  .  benztropine (COGENTIN) 1 MG tablet, Take 1 mg by mouth 2 (two) times daily., Disp: , Rfl:  .  HALOPERIDOL PO, Take 3 mg by mouth at bedtime. Take one and one half tablets by mouth at bedtime, Disp: , Rfl:  .  Omega-3 Fatty Acids (FISH OIL PO), Take 3,000 mg by mouth daily. , Disp: , Rfl:  .  simvastatin (ZOCOR) 20 MG tablet, TAKE ONE TABLET BY MOUTH AT BEDTIME, Disp: 30 tablet, Rfl: 4   Review of Systems  Constitutional: Negative for appetite change, chills, diaphoresis, fatigue, fever and unexpected weight change.  HENT: Negative for congestion, dental problem, drooling, ear pain, facial swelling, hearing loss, mouth sores, sneezing, sore throat, trouble swallowing and voice change.   Eyes: Negative for pain, discharge, redness, itching and visual disturbance.  Respiratory: Positive for shortness of breath. Negative for cough, choking and wheezing.   Cardiovascular: Negative for chest pain, palpitations and leg swelling.  Gastrointestinal: Negative for abdominal pain, blood in stool, constipation, diarrhea and vomiting.  Endocrine: Negative for cold intolerance, heat intolerance and polydipsia.  Genitourinary: Negative for decreased urine volume, dysuria and hematuria.  Musculoskeletal:  Positive for back pain. Negative for arthralgias and gait problem.  Skin: Negative for rash.  Allergic/Immunologic: Positive for environmental allergies.  Neurological: Negative for seizures, syncope, light-headedness and headaches.  Hematological: Negative for adenopathy.  Psychiatric/Behavioral: Negative for agitation, dysphoric mood and suicidal ideas. The patient is not nervous/anxious.     Per HPI unless specifically indicated above     Objective:    BP 112/74 (BP Location: Left Arm, Patient Position: Sitting, Cuff Size: Large)   Pulse 85   Temp 97.9 F (36.6 C) (Other (Comment))   Ht 6' 0.5" (1.842 m)   Wt 293 lb (132.9 kg)   SpO2 97%   BMI 39.19 kg/m   Wt Readings from Last 3 Encounters:  07/06/16 293 lb (132.9 kg)  01/05/16 289 lb 8 oz (131.3 kg)  07/08/15 284 lb (128.8 kg)    Physical Exam  Constitutional: He is oriented to person, place, and time. He appears well-developed and well-nourished.  HENT:  Head: Normocephalic and atraumatic.  Neck: Neck supple.  Cardiovascular: Normal rate and regular rhythm.   Pulmonary/Chest: Effort normal and breath sounds normal. He has no wheezes.  Abdominal: Soft. Bowel sounds are normal. There is no hepatosplenomegaly. There is no tenderness.  Musculoskeletal: He exhibits no edema.  Lymphadenopathy:    He has no cervical adenopathy.  Neurological: He is alert and oriented to person, place, and time.  Skin: Skin is warm and dry.  Psychiatric: He has a normal mood and affect. His behavior is normal.  Vitals reviewed.  Results for orders placed or performed in visit on 06/27/16  COMPLETE METABOLIC PANEL WITH GFR  Result Value Ref Range   Sodium 142 135 - 146 mmol/L   Potassium 4.4 3.5 - 5.3 mmol/L   Chloride 104 98 - 110 mmol/L   CO2 29 20 - 31 mmol/L   Glucose, Bld 107 (H) 65 - 99 mg/dL   BUN 8 7 - 25 mg/dL   Creat 3.081.07 6.570.60 - 8.461.35 mg/dL   Total Bilirubin 0.5 0.2 - 1.2 mg/dL   Alkaline Phosphatase 62 40 - 115 U/L    AST 33 10 - 40 U/L   ALT 65 (H) 9 - 46 U/L   Total Protein 7.0 6.1 - 8.1 g/dL   Albumin 4.4 3.6 - 5.1 g/dL   Calcium 9.7 8.6 - 96.210.3 mg/dL   GFR, Est African American >89 >=60 mL/min   GFR, Est Non African American 81 >=60 mL/min  Lipid Profile  Result Value Ref Range   Cholesterol 176 <200 mg/dL   Triglycerides 952230 (H) <150 mg/dL   HDL 31 (L) >84>40 mg/dL   Total CHOL/HDL Ratio 5.7 (H) <5.0 Ratio   VLDL 46 (H) <30 mg/dL   LDL Cholesterol 99 <132<100 mg/dL      Assessment & Plan:   Encounter Diagnoses  Name Primary?  . Hyperlipidemia, unspecified hyperlipidemia type Yes  . Chronic back pain, unspecified back location, unspecified back pain laterality   . Class 2 obesity with body mass index (BMI) of 39.0 to 39.9 in adult, unspecified obesity type, unspecified whether serious comorbidity present   . Mood disorder (HCC)   . Screening for prostate cancer      -reviewed labs with pt -pt to continue current medications -counseled pt on regular exercise to help with weight and deconditioning -follow up 4 months.  RTO sooner prn

## 2016-10-26 ENCOUNTER — Other Ambulatory Visit (HOSPITAL_COMMUNITY)
Admission: RE | Admit: 2016-10-26 | Discharge: 2016-10-26 | Disposition: A | Discharge status: Home / Self Care | Payer: Self-pay | Source: Ambulatory Visit | Attending: Physician Assistant | Admitting: Physician Assistant

## 2016-10-26 LAB — COMPREHENSIVE METABOLIC PANEL WITH GFR
ALT: 65 U/L — ABNORMAL HIGH (ref 17–63)
AST: 34 U/L (ref 15–41)
Albumin: 4.2 g/dL (ref 3.5–5.0)
Alkaline Phosphatase: 63 U/L (ref 38–126)
Anion gap: 9 (ref 5–15)
BUN: 7 mg/dL (ref 6–20)
CO2: 27 mmol/L (ref 22–32)
Calcium: 9.3 mg/dL (ref 8.9–10.3)
Chloride: 104 mmol/L (ref 101–111)
Creatinine, Ser: 0.95 mg/dL (ref 0.61–1.24)
GFR calc Af Amer: >60 mL/min
GFR calc non Af Amer: >60 mL/min
Glucose, Bld: 106 mg/dL — ABNORMAL HIGH (ref 65–99)
Potassium: 3.8 mmol/L (ref 3.5–5.1)
Sodium: 140 mmol/L (ref 135–145)
Total Bilirubin: 0.6 mg/dL (ref 0.3–1.2)
Total Protein: 7.1 g/dL (ref 6.5–8.1)

## 2016-10-26 LAB — LIPID PANEL
CHOL/HDL RATIO: 4.8 ratio
Cholesterol: 168 mg/dL (ref 0–200)
HDL: 35 mg/dL — ABNORMAL LOW (ref >40)
LDL CALC: 91 mg/dL (ref 0–99)
Triglycerides: 210 mg/dL — ABNORMAL HIGH (ref <150)
VLDL: 42 mg/dL — AB (ref 0–40)

## 2016-10-26 LAB — PSA: PSA: 1.7 ng/mL (ref 0.00–4.00)

## 2016-11-03 ENCOUNTER — Encounter: Payer: Self-pay | Admitting: Physician Assistant

## 2016-11-03 ENCOUNTER — Ambulatory Visit: Payer: Self-pay | Admitting: Physician Assistant

## 2016-11-03 ENCOUNTER — Other Ambulatory Visit: Payer: Self-pay | Admitting: Physician Assistant

## 2016-11-03 VITALS — BP 116/68 | HR 87 | Temp 98.1°F | Ht 72.5 in | Wt 289.0 lb

## 2016-11-03 DIAGNOSIS — Z1211 Encounter for screening for malignant neoplasm of colon: Secondary | ICD-10-CM

## 2016-11-03 DIAGNOSIS — E785 Hyperlipidemia, unspecified: Secondary | ICD-10-CM

## 2016-11-03 DIAGNOSIS — S61002A Unspecified open wound of left thumb without damage to nail, initial encounter: Secondary | ICD-10-CM

## 2016-11-03 DIAGNOSIS — E669 Obesity, unspecified: Secondary | ICD-10-CM

## 2016-11-03 NOTE — Progress Notes (Signed)
BP 116/68 (BP Location: Left Arm, Patient Position: Sitting, Cuff Size: Large)   Pulse 87   Temp 98.1 F (36.7 C) (Other (Comment))   Ht 6' 0.5" (1.842 m)   Wt 289 lb (131.1 kg)   SpO2 97%   BMI 38.66 kg/m    Subjective:    Patient ID: Malik Blake, male    DOB: [birth year], 51 y.o.   MRN: [id number]  HPI: Malik Blake is a 51 y.o. male presenting on 11/03/2016 for Hyperlipidemia   HPI   Pt ran out of fish oil 3 days ago  He is still going to [facility]  Pt is feeling well today and has no complaints  Pt did slice his thumb a few days ago.  His Td is UTD  Relevant past medical, surgical, family and social history reviewed and updated as indicated. Interim medical history since our last visit reviewed. Allergies and medications reviewed and updated.   Current Outpatient Prescriptions:  .  benztropine (COGENTIN) 1 MG tablet, Take 1 mg by mouth 2 (two) times daily., Disp: , Rfl:  .  HALOPERIDOL PO, Take 3 mg by mouth at bedtime. Take one and one half tablets by mouth at bedtime, Disp: , Rfl:  .  simvastatin (ZOCOR) 20 MG tablet, TAKE ONE TABLET BY MOUTH AT BEDTIME, Disp: 30 tablet, Rfl: 4 .  Omega-3 Fatty Acids (FISH OIL PO), Take 3,000 mg by mouth daily. , Disp: , Rfl:    Review of Systems  Constitutional: Positive for fatigue. Negative for appetite change, chills, diaphoresis, fever and unexpected weight change.  HENT: Negative for congestion, drooling, ear pain, facial swelling, hearing loss, mouth sores, sneezing, sore throat, trouble swallowing and voice change.   Eyes: Negative for pain, discharge, redness, itching and visual disturbance.  Respiratory: Negative for cough, choking, shortness of breath and wheezing.   Cardiovascular: Negative for chest pain, palpitations and leg swelling.  Gastrointestinal: Negative for abdominal pain, blood in stool, constipation, diarrhea and vomiting.  Endocrine: Positive for polydipsia. Negative for cold intolerance and heat  intolerance.  Genitourinary: Negative for decreased urine volume, dysuria and hematuria.  Musculoskeletal: Positive for back pain and gait problem. Negative for arthralgias.  Skin: Negative for rash.  Allergic/Immunologic: Negative for environmental allergies.  Neurological: Negative for seizures, syncope, light-headedness and headaches.  Hematological: Negative for adenopathy.  Psychiatric/Behavioral: Negative for agitation, dysphoric mood and suicidal ideas. The patient is not nervous/anxious.     Per HPI unless specifically indicated above     Objective:    BP 116/68 (BP Location: Left Arm, Patient Position: Sitting, Cuff Size: Large)   Pulse 87   Temp 98.1 F (36.7 C) (Other (Comment))   Ht 6' 0.5" (1.842 m)   Wt 289 lb (131.1 kg)   SpO2 97%   BMI 38.66 kg/m   Wt Readings from Last 3 Encounters:  11/03/16 289 lb (131.1 kg)  07/06/16 293 lb (132.9 kg)  01/05/16 289 lb 8 oz (131.3 kg)    Physical Exam  Constitutional: He is oriented to person, place, and time. He appears well-developed and well-nourished.  HENT:  Head: Normocephalic and atraumatic.  Neck: Neck supple.  Cardiovascular: Normal rate and regular rhythm.   Pulmonary/Chest: Effort normal and breath sounds normal. He has no wheezes.  Abdominal: Soft. Bowel sounds are normal. There is no hepatosplenomegaly. There is no tenderness.  Musculoskeletal: He exhibits no edema.       Left hand: He exhibits laceration. He exhibits normal range of motion, no  tenderness, no bony tenderness, normal capillary refill, no deformity and no swelling.       Hands: 1 cm avulsion L thumb. Clean and appears to be healing normally. No signs infections.   Lymphadenopathy:    He has no cervical adenopathy.  Neurological: He is alert and oriented to person, place, and time.  Skin: Skin is warm and dry.  Psychiatric: He has a normal mood and affect. His behavior is normal.  Vitals reviewed.   Results for orders placed or performed  during the hospital encounter of 10/26/16  Comprehensive metabolic panel  Result Value Ref Range   Sodium 140 135 - 145 mmol/L   Potassium 3.8 3.5 - 5.1 mmol/L   Chloride 104 101 - 111 mmol/L   CO2 27 22 - 32 mmol/L   Glucose, Bld 106 (H) 65 - 99 mg/dL   BUN 7 6 - 20 mg/dL   Creatinine, Ser 1.610.95 0.61 - 1.24 mg/dL   Calcium 9.3 8.9 - 09.610.3 mg/dL   Total Protein 7.1 6.5 - 8.1 g/dL   Albumin 4.2 3.5 - 5.0 g/dL   AST 34 15 - 41 U/L   ALT 65 (H) 17 - 63 U/L   Alkaline Phosphatase 63 38 - 126 U/L   Total Bilirubin 0.6 0.3 - 1.2 mg/dL   GFR calc non Af Amer >60 >60 mL/min   GFR calc Af Amer >60 >60 mL/min   Anion gap 9 5 - 15  Lipid panel  Result Value Ref Range   Cholesterol 168 0 - 200 mg/dL   Triglycerides 045210 (H) <150 mg/dL   HDL 35 (L) >40>40 mg/dL   Total CHOL/HDL Ratio 4.8 RATIO   VLDL 42 (H) 0 - 40 mg/dL   LDL Cholesterol 91 0 - 99 mg/dL  PSA  Result Value Ref Range   PSA 1.70 0.00 - 4.00 ng/mL      Assessment & Plan:   Encounter Diagnoses  Name Primary?  . Hyperlipidemia, unspecified hyperlipidemia type Yes  . Obesity, unspecified classification, unspecified obesity type, unspecified whether serious comorbidity present   . Open wound of left thumb, initial encounter   . Screening for colon cancer      -reviewed labs with pt -Gave iFOBT for colon cancer screening -pt Thumb okay. Counseled on wound care -F/u 4 months. RTO sooner prn

## 2016-11-08 ENCOUNTER — Other Ambulatory Visit: Payer: Self-pay | Admitting: Physician Assistant

## 2016-11-09 ENCOUNTER — Other Ambulatory Visit: Payer: Self-pay | Admitting: Physician Assistant

## 2016-11-09 DIAGNOSIS — E785 Hyperlipidemia, unspecified: Secondary | ICD-10-CM

## 2016-11-24 LAB — IFOBT (OCCULT BLOOD): IMMUNOLOGICAL FECAL OCCULT BLOOD TEST: NEGATIVE

## 2017-03-01 ENCOUNTER — Other Ambulatory Visit (HOSPITAL_COMMUNITY)
Admission: RE | Admit: 2017-03-01 | Discharge: 2017-03-01 | Disposition: A | Discharge status: Home / Self Care | Payer: Self-pay | Source: Ambulatory Visit | Attending: Physician Assistant | Admitting: Physician Assistant

## 2017-03-01 DIAGNOSIS — E785 Hyperlipidemia, unspecified: Secondary | ICD-10-CM | POA: Insufficient documentation

## 2017-03-01 DIAGNOSIS — Z125 Encounter for screening for malignant neoplasm of prostate: Secondary | ICD-10-CM | POA: Insufficient documentation

## 2017-03-01 LAB — COMPREHENSIVE METABOLIC PANEL
ALBUMIN: 4.3 g/dL (ref 3.5–5.0)
ALK PHOS: 66 U/L (ref 38–126)
ALT: 66 U/L — ABNORMAL HIGH (ref 17–63)
AST: 40 U/L (ref 15–41)
Anion gap: 8 (ref 5–15)
BILIRUBIN TOTAL: 0.6 mg/dL (ref 0.3–1.2)
BUN: 5 mg/dL — AB (ref 6–20)
CALCIUM: 9 mg/dL (ref 8.9–10.3)
CO2: 26 mmol/L (ref 22–32)
CREATININE: 0.98 mg/dL (ref 0.61–1.24)
Chloride: 104 mmol/L (ref 101–111)
GFR calc Af Amer: >60 mL/min (ref >60)
GFR calc non Af Amer: >60 mL/min (ref >60)
GLUCOSE: 116 mg/dL — AB (ref 65–99)
Potassium: 3.7 mmol/L (ref 3.5–5.1)
Sodium: 138 mmol/L (ref 135–145)
Total Protein: 7.1 g/dL (ref 6.5–8.1)

## 2017-03-01 LAB — LIPID PANEL
Cholesterol: 155 mg/dL (ref 0–200)
HDL: 30 mg/dL — ABNORMAL LOW (ref >40)
LDL CALC: 83 mg/dL (ref 0–99)
Total CHOL/HDL Ratio: 5.2 RATIO
Triglycerides: 212 mg/dL — ABNORMAL HIGH (ref <150)
VLDL: 42 mg/dL — ABNORMAL HIGH (ref 0–40)

## 2017-03-01 LAB — PSA: Prostatic Specific Antigen: 1.74 ng/mL (ref 0.00–4.00)

## 2017-03-07 ENCOUNTER — Ambulatory Visit: Payer: Self-pay | Admitting: Physician Assistant

## 2017-03-07 ENCOUNTER — Encounter: Payer: Self-pay | Admitting: Physician Assistant

## 2017-03-07 VITALS — BP 112/80 | HR 96 | Temp 97.9°F

## 2017-03-07 DIAGNOSIS — F39 Unspecified mood [affective] disorder: Secondary | ICD-10-CM

## 2017-03-07 DIAGNOSIS — E785 Hyperlipidemia, unspecified: Secondary | ICD-10-CM

## 2017-03-07 NOTE — Progress Notes (Signed)
BP 112/80   Pulse 96   Temp 97.9 F (36.6 C)   SpO2 97%    Subjective:    Patient ID: Malik Blake, male    DOB: Jun 01, 1965, 51 y.o.   MRN: [id number]  HPI: Malik Blake is a 51 y.o. male presenting on 03/07/2017 for Hyperlipidemia   HPI  Pt is still going to counseling at [facility]  Pt is doing well and has no complaints today   Relevant past medical, surgical, family and social history reviewed and updated as indicated. Interim medical history since our last visit reviewed. Allergies and medications reviewed and updated.    Current Outpatient Prescriptions:  .  benztropine (COGENTIN) 1 MG tablet, Take 1 mg by mouth 2 (two) times daily., Disp: , Rfl:  .  HALOPERIDOL PO, Take 3 mg by mouth at bedtime. Take one and one half tablets by mouth at bedtime, Disp: , Rfl:  .  Omega-3 Fatty Acids (FISH OIL PO), Take 3,000 mg by mouth daily. , Disp: , Rfl:  .  simvastatin (ZOCOR) 20 MG tablet, TAKE ONE TABLET BY MOUTH AT BEDTIME, Disp: 30 tablet, Rfl: 4  Review of Systems  Constitutional: Negative for appetite change, chills, diaphoresis, fatigue, fever and unexpected weight change.  HENT: Negative for congestion, dental problem, drooling, ear pain, facial swelling, hearing loss, mouth sores, sneezing, sore throat, trouble swallowing and voice change.   Eyes: Negative for pain, discharge, redness, itching and visual disturbance.  Respiratory: Negative for cough, choking, shortness of breath and wheezing.   Cardiovascular: Negative for chest pain, palpitations and leg swelling.  Gastrointestinal: Negative for abdominal pain, blood in stool, constipation, diarrhea and vomiting.  Endocrine: Negative for cold intolerance, heat intolerance and polydipsia.  Genitourinary: Negative for decreased urine volume, dysuria and hematuria.  Musculoskeletal: Positive for back pain. Negative for arthralgias and gait problem.  Skin: Negative for rash.  Allergic/Immunologic: Positive for environmental  allergies.  Neurological: Negative for seizures, syncope, light-headedness and headaches.  Hematological: Negative for adenopathy.  Psychiatric/Behavioral: Negative for agitation, dysphoric mood and suicidal ideas. The patient is not nervous/anxious.     Per HPI unless specifically indicated above     Objective:    BP 112/80   Pulse 96   Temp 97.9 F (36.6 C)   SpO2 97%   Wt Readings from Last 3 Encounters:  11/03/16 289 lb (131.1 kg)  07/06/16 293 lb (132.9 kg)  01/05/16 289 lb 8 oz (131.3 kg)    Physical Exam  Constitutional: He is oriented to person, place, and time. He appears well-developed and well-nourished.  HENT:  Head: Normocephalic and atraumatic.  Neck: Neck supple.  Cardiovascular: Normal rate and regular rhythm.   Pulmonary/Chest: Effort normal and breath sounds normal. He has no wheezes.  Abdominal: Soft. Bowel sounds are normal. There is no hepatosplenomegaly. There is no tenderness.  Musculoskeletal: He exhibits no edema.  Lymphadenopathy:    He has no cervical adenopathy.  Neurological: He is alert and oriented to person, place, and time.  Skin: Skin is warm and dry.  Psychiatric: He has a normal mood and affect. His behavior is normal.  Vitals reviewed.   Results for orders placed or performed during the hospital encounter of 03/01/17  Comprehensive metabolic panel  Result Value Ref Range   Sodium 138 135 - 145 mmol/L   Potassium 3.7 3.5 - 5.1 mmol/L   Chloride 104 101 - 111 mmol/L   CO2 26 22 - 32 mmol/L   Glucose, Bld 116 (  H) 65 - 99 mg/dL   BUN 5 (L) 6 - 20 mg/dL   Creatinine, Ser 1.61 0.61 - 1.24 mg/dL   Calcium 9.0 8.9 - 09.6 mg/dL   Total Protein 7.1 6.5 - 8.1 g/dL   Albumin 4.3 3.5 - 5.0 g/dL   AST 40 15 - 41 U/L   ALT 66 (H) 17 - 63 U/L   Alkaline Phosphatase 66 38 - 126 U/L   Total Bilirubin 0.6 0.3 - 1.2 mg/dL   GFR calc non Af Amer >60 >60 mL/min   GFR calc Af Amer >60 >60 mL/min   Anion gap 8 5 - 15  PSA  Result Value Ref Range    Prostatic Specific Antigen 1.74 0.00 - 4.00 ng/mL  Lipid Profile  Result Value Ref Range   Cholesterol 155 0 - 200 mg/dL   Triglycerides 045 (H) <150 mg/dL   HDL 30 (L) >40 mg/dL   Total CHOL/HDL Ratio 5.2 RATIO   VLDL 42 (H) 0 - 40 mg/dL   LDL Cholesterol 83 0 - 99 mg/dL      Assessment & Plan:   Encounter Diagnoses  Name Primary?  . Hyperlipidemia, unspecified hyperlipidemia type Yes  . Mood disorder (HCC)      -reviewed labs with pt -pt to continue with current medications -pt to continue with [facility] for mood disorder -pt to follow up 4 months.  RTO sooner prn

## 2017-03-08 ENCOUNTER — Ambulatory Visit: Payer: Self-pay | Admitting: Physician Assistant

## 2017-04-12 ENCOUNTER — Ambulatory Visit: Payer: Self-pay | Admitting: Physician Assistant

## 2017-04-12 ENCOUNTER — Encounter: Payer: Self-pay | Admitting: Physician Assistant

## 2017-04-12 VITALS — BP 120/72 | HR 86 | Temp 98.1°F | Ht 72.5 in | Wt 291.0 lb

## 2017-04-12 DIAGNOSIS — R42 Dizziness and giddiness: Secondary | ICD-10-CM

## 2017-04-12 NOTE — Progress Notes (Signed)
BP 120/72 (BP Location: Left Arm, Patient Position: Sitting, Cuff Size: Large)   Pulse 86   Temp 98.1 F (36.7 C)   Ht 6' 0.5" (1.842 m)   Wt 291 lb (132 kg)   SpO2 98%   BMI 38.92 kg/m    Subjective:    Patient ID: Malik Blake, male    DOB: 08-06-1965, 51 y.o.   MRN: [id number]  HPI: Malik Blake is a 51 y.o. male presenting on 04/12/2017 for Dizziness (for past 6 days)   HPI  Chief Complaint  Patient presents with  . Dizziness    for past 6 days   The dizziness comes and goes.   OTC dramamine helps some.  Upon awakening and when he turned his head, room would start spinning.   Positional changes make it worse.     He started the latuda about 3 weeks ago and the haldol was stopped and his cogentin was cut in half.  His dizzy symptoms began 2 weeks after starting the new medication  He has also got new glasses recently.   [facility]  He followed up 2 weeks after starting the new meds but it was the day before the dizziness started.   He is supposed to go back in about 3 months  Relevant past medical, surgical, family and social history reviewed and updated as indicated. Interim medical history since our last visit reviewed. Allergies and medications reviewed and updated.   Current Outpatient Medications:  .  benztropine (COGENTIN) 1 MG tablet, Take 0.5 mg 2 (two) times daily by mouth. , Disp: , Rfl:  .  lurasidone (LATUDA) 40 MG TABS tablet, Take 40 mg daily with breakfast by mouth., Disp: , Rfl:  .  Omega-3 Fatty Acids (FISH OIL PO), Take 3,000 mg by mouth daily. , Disp: , Rfl:  .  simvastatin (ZOCOR) 20 MG tablet, TAKE ONE TABLET BY MOUTH AT BEDTIME, Disp: 30 tablet, Rfl: 4   Review of Systems  Constitutional: Positive for fatigue. Negative for appetite change, chills, diaphoresis, fever and unexpected weight change.  HENT: Positive for congestion, ear pain and sore throat. Negative for dental problem, drooling, facial swelling, hearing loss, mouth  sores, sneezing, trouble swallowing and voice change.   Eyes: Positive for pain, discharge, redness and itching. Negative for visual disturbance.  Respiratory: Negative for cough, choking, shortness of breath and wheezing.   Cardiovascular: Negative for chest pain, palpitations and leg swelling.  Gastrointestinal: Negative for abdominal pain, blood in stool, constipation, diarrhea and vomiting.  Endocrine: Negative for cold intolerance, heat intolerance and polydipsia.  Genitourinary: Negative for decreased urine volume, dysuria and hematuria.  Musculoskeletal: Positive for arthralgias and back pain. Negative for gait problem.  Skin: Negative for rash.  Allergic/Immunologic: Positive for environmental allergies.  Neurological: Positive for light-headedness. Negative for seizures, syncope and headaches.  Hematological: Negative for adenopathy.  Psychiatric/Behavioral: Negative for agitation, dysphoric mood and suicidal ideas. The patient is nervous/anxious.     Per HPI unless specifically indicated above     Objective:    BP 120/72 (BP Location: Left Arm, Patient Position: Sitting, Cuff Size: Large)   Pulse 86   Temp 98.1 F (36.7 C)   Ht 6' 0.5" (1.842 m)   Wt 291 lb (132 kg)   SpO2 98%   BMI 38.92 kg/m   Wt Readings from Last 3 Encounters:  04/12/17 291 lb (132 kg)  11/03/16 289 lb (131.1 kg)  07/06/16 293 lb (132.9  kg)    Orthostatic VS for the past 24 hrs:  BP- Lying Pulse- Lying BP- Sitting Pulse- Sitting BP- Standing at 0 minutes Pulse- Standing at 0 minutes  04/12/17 1442 117/76 75 110/76 81 117/80 84     Physical Exam  Constitutional: He is oriented to person, place, and time. He appears well-developed and well-nourished.  HENT:  Head: Normocephalic and atraumatic.  Right Ear: Hearing, tympanic membrane, external ear and ear canal normal.  Left Ear: Hearing, tympanic membrane, external ear and ear canal normal.  Nose: Nose normal.  Mouth/Throat: Uvula is midline  and oropharynx is clear and moist. No uvula swelling. No oropharyngeal exudate, posterior oropharyngeal edema, posterior oropharyngeal erythema or tonsillar abscesses.  Eyes: Conjunctivae and EOM are normal. Pupils are equal, round, and reactive to light.  EOM testing does not illicit diziness.   Neck: Neck supple.  Cardiovascular: Normal rate and regular rhythm.  Pulmonary/Chest: Effort normal and breath sounds normal. He has no wheezes.  Abdominal: Soft. Bowel sounds are normal. There is no hepatosplenomegaly. There is no tenderness.  Musculoskeletal: He exhibits no edema.  Lymphadenopathy:    He has no cervical adenopathy.  Neurological: He is alert and oriented to person, place, and time. He has normal strength. He displays no tremor. No cranial nerve deficit. He exhibits normal muscle tone. Coordination and gait normal.  Skin: Skin is warm and dry.  Psychiatric: He has a normal mood and affect. His behavior is normal.  Vitals reviewed.       Assessment & Plan:   Encounter Diagnosis  Name Primary?  . Dizziness Yes     -discussed with pt that new meds likely cause of dizziness.  Pt to contact his mental health provider and return there to discuss medications and symptoms -no meclizine due to interactions with latuda -pt to RTO as needed (ie if this persists or mental health feels medications not cause of symptoms)

## 2017-04-16 ENCOUNTER — Other Ambulatory Visit: Payer: Self-pay | Admitting: Physician Assistant

## 2017-06-06 ENCOUNTER — Ambulatory Visit: Payer: Self-pay | Admitting: Physician Assistant

## 2017-06-06 ENCOUNTER — Encounter: Payer: Self-pay | Admitting: Physician Assistant

## 2017-06-06 VITALS — BP 116/78 | HR 73 | Temp 97.9°F | Wt 291.5 lb

## 2017-06-06 DIAGNOSIS — Z711 Person with feared health complaint in whom no diagnosis is made: Secondary | ICD-10-CM

## 2017-06-06 NOTE — Progress Notes (Signed)
BP 116/78 (BP Location: Left Arm, Patient Position: Sitting, Cuff Size: Normal)   Pulse 73   Temp 97.9 F (36.6 C)   Wt 291 lb 8 oz (132.2 kg)   SpO2 96%   BMI 38.99 kg/m    Subjective:    Patient ID: Malik Blake, male    DOB: 1966-03-11, 52 y.o.   MRN: [id number]  HPI: Malik Blake is a 52 y.o. male presenting on 06/06/2017 for Ear Pain ("very sharp pain" on eft ear that goes down to tooth. pain is off and on. pt states he feels he has hearing loss. pt states going on for 3 weeks) and Neck Pain   HPI   Pt had L ear pain radiating into tooth.  Pt says ear pain is gone now but he came to appt anyway because his mother scheduled it and he says she would have gotten mad if he hadn't come in.   Relevant past medical, surgical, family and social history reviewed and updated as indicated. Interim medical history since our last visit reviewed. Allergies and medications reviewed and updated.  Review of Systems  Constitutional: Negative for appetite change, chills, diaphoresis, fatigue, fever and unexpected weight change.  HENT: Positive for dental problem and ear pain. Negative for congestion, drooling, facial swelling, hearing loss, mouth sores, sneezing, sore throat, trouble swallowing and voice change.   Eyes: Negative for pain, discharge, redness, itching and visual disturbance.  Respiratory: Negative for cough, choking, shortness of breath and wheezing.   Cardiovascular: Negative for chest pain, palpitations and leg swelling.  Gastrointestinal: Negative for abdominal pain, blood in stool, constipation, diarrhea and vomiting.  Endocrine: Negative for cold intolerance, heat intolerance and polydipsia.  Genitourinary: Negative for decreased urine volume, dysuria and hematuria.  Musculoskeletal: Positive for arthralgias and back pain. Negative for gait problem.  Skin: Negative for rash.  Allergic/Immunologic: Negative for environmental allergies.  Neurological: Negative for seizures,  syncope, light-headedness and headaches.  Hematological: Negative for adenopathy.  Psychiatric/Behavioral: Negative for agitation, dysphoric mood and suicidal ideas. The patient is not nervous/anxious.     Per HPI unless specifically indicated above     Objective:    BP 116/78 (BP Location: Left Arm, Patient Position: Sitting, Cuff Size: Normal)   Pulse 73   Temp 97.9 F (36.6 C)   Wt 291 lb 8 oz (132.2 kg)   SpO2 96%   BMI 38.99 kg/m   Wt Readings from Last 3 Encounters:  06/06/17 291 lb 8 oz (132.2 kg)  04/12/17 291 lb (132 kg)  11/03/16 289 lb (131.1 kg)    Physical Exam  Constitutional: He is oriented to person, place, and time. He appears well-developed and well-nourished.  HENT:  Head: Normocephalic and atraumatic.  Right Ear: Hearing, tympanic membrane, external ear and ear canal normal.  Left Ear: Hearing, tympanic membrane, external ear and ear canal normal.  Nose: Nose normal.  Mouth/Throat: Uvula is midline and oropharynx is clear and moist. No uvula swelling. No oropharyngeal exudate, posterior oropharyngeal edema, posterior oropharyngeal erythema or tonsillar abscesses.  Neck: Neck supple.  Cardiovascular: Normal rate and regular rhythm.  Pulmonary/Chest: Effort normal and breath sounds normal.  Lymphadenopathy:    He has no cervical adenopathy.  Neurological: He is alert and oriented to person, place, and time.  Skin: Skin is warm and dry.  Psychiatric: He has a normal mood and affect. His behavior is normal.  Vitals reviewed.       Assessment & Plan:    Encounter  Diagnosis  Name Primary?  Marland Kitchen No problem, feared complaint unfounded Yes      -pt symptoms resolved -pt to follow up as scheduled

## 2017-06-26 ENCOUNTER — Other Ambulatory Visit (HOSPITAL_COMMUNITY)
Admission: RE | Admit: 2017-06-26 | Discharge: 2017-06-26 | Disposition: A | Discharge status: Home / Self Care | Payer: Self-pay | Source: Ambulatory Visit | Attending: Physician Assistant | Admitting: Physician Assistant

## 2017-06-26 DIAGNOSIS — E785 Hyperlipidemia, unspecified: Secondary | ICD-10-CM | POA: Insufficient documentation

## 2017-06-26 LAB — COMPREHENSIVE METABOLIC PANEL
ALT: 59 U/L (ref 17–63)
ANION GAP: 13 (ref 5–15)
AST: 41 U/L (ref 15–41)
Albumin: 4.4 g/dL (ref 3.5–5.0)
Alkaline Phosphatase: 54 U/L (ref 38–126)
BUN: 7 mg/dL (ref 6–20)
CALCIUM: 9.6 mg/dL (ref 8.9–10.3)
CHLORIDE: 103 mmol/L (ref 101–111)
CO2: 24 mmol/L (ref 22–32)
Creatinine, Ser: 1.04 mg/dL (ref 0.61–1.24)
Glucose, Bld: 111 mg/dL — ABNORMAL HIGH (ref 65–99)
Potassium: 4.5 mmol/L (ref 3.5–5.1)
SODIUM: 140 mmol/L (ref 135–145)
Total Bilirubin: 0.9 mg/dL (ref 0.3–1.2)
Total Protein: 7.3 g/dL (ref 6.5–8.1)

## 2017-06-26 LAB — LIPID PANEL
CHOLESTEROL: 156 mg/dL (ref 0–200)
HDL: 35 mg/dL — AB (ref >40)
LDL Cholesterol: 84 mg/dL (ref 0–99)
TRIGLYCERIDES: 184 mg/dL — AB (ref <150)
Total CHOL/HDL Ratio: 4.5 RATIO
VLDL: 37 mg/dL (ref 0–40)

## 2017-07-05 ENCOUNTER — Ambulatory Visit: Payer: Self-pay | Admitting: Physician Assistant

## 2017-07-05 ENCOUNTER — Encounter: Payer: Self-pay | Admitting: Physician Assistant

## 2017-07-05 VITALS — BP 112/80 | HR 87 | Temp 98.1°F | Ht 72.5 in | Wt 292.0 lb

## 2017-07-05 DIAGNOSIS — G8929 Other chronic pain: Secondary | ICD-10-CM

## 2017-07-05 DIAGNOSIS — E785 Hyperlipidemia, unspecified: Secondary | ICD-10-CM

## 2017-07-05 DIAGNOSIS — M549 Dorsalgia, unspecified: Secondary | ICD-10-CM

## 2017-07-05 DIAGNOSIS — F39 Unspecified mood [affective] disorder: Secondary | ICD-10-CM

## 2017-07-05 DIAGNOSIS — E669 Obesity, unspecified: Secondary | ICD-10-CM

## 2017-07-05 NOTE — Progress Notes (Signed)
BP 112/80 (BP Location: Left Arm, Patient Position: Sitting, Cuff Size: Normal)   Pulse 87   Temp 98.1 F (36.7 C)   Ht 6' 0.5" (1.842 m)   Wt 292 lb (132.5 kg)   SpO2 96%   BMI 39.06 kg/m    Subjective:    Patient ID: Malik Blake, male    DOB: 1965-07-17, 52 y.o.   MRN: [id number]  HPI: Malik Blake is a 52 y.o. male presenting on 07/05/2017 for Hyperlipidemia   HPI   Pt is still going to [facility]  Pt has no complaints today  Relevant past medical, surgical, family and social history reviewed and updated as indicated. Interim medical history since our last visit reviewed. Allergies and medications reviewed and updated.   Current Outpatient Medications:  .  lurasidone (LATUDA) 40 MG TABS tablet, Take 40 mg daily with breakfast by mouth., Disp: , Rfl:  .  Omega-3 Fatty Acids (FISH OIL PO), Take 2,080 mg by mouth daily. , Disp: , Rfl:  .  simvastatin (ZOCOR) 20 MG tablet, TAKE 1 TABLET BY MOUTH AT BEDTIME, Disp: 30 tablet, Rfl: 4   Review of Systems  Constitutional: Negative for appetite change, chills, diaphoresis, fatigue, fever and unexpected weight change.  HENT: Positive for congestion and hearing loss. Negative for dental problem, drooling, ear pain, facial swelling, mouth sores, sneezing, sore throat, trouble swallowing and voice change.   Eyes: Positive for pain and visual disturbance. Negative for discharge, redness and itching.  Respiratory: Negative for cough, choking, shortness of breath and wheezing.   Cardiovascular: Negative for chest pain, palpitations and leg swelling.  Gastrointestinal: Negative for abdominal pain, blood in stool, constipation, diarrhea and vomiting.  Endocrine: Negative for cold intolerance, heat intolerance and polydipsia.  Genitourinary: Negative for decreased urine volume, dysuria and hematuria.  Musculoskeletal: Positive for back pain. Negative for arthralgias and gait problem.  Skin: Negative for rash.  Allergic/Immunologic:  Negative for environmental allergies.  Neurological: Negative for seizures, syncope, light-headedness and headaches.  Hematological: Negative for adenopathy.  Psychiatric/Behavioral: Negative for agitation, dysphoric mood and suicidal ideas. The patient is nervous/anxious.     Per HPI unless specifically indicated above     Objective:    BP 112/80 (BP Location: Left Arm, Patient Position: Sitting, Cuff Size: Normal)   Pulse 87   Temp 98.1 F (36.7 C)   Ht 6' 0.5" (1.842 m)   Wt 292 lb (132.5 kg)   SpO2 96%   BMI 39.06 kg/m   Wt Readings from Last 3 Encounters:  07/05/17 292 lb (132.5 kg)  06/06/17 291 lb 8 oz (132.2 kg)  04/12/17 291 lb (132 kg)    Physical Exam  Constitutional: He is oriented to person, place, and time. He appears well-developed and well-nourished.  HENT:  Head: Normocephalic and atraumatic.  Neck: Neck supple.  Cardiovascular: Normal rate and regular rhythm.  Pulmonary/Chest: Effort normal and breath sounds normal. He has no wheezes.  Abdominal: Soft. Bowel sounds are normal. There is no hepatosplenomegaly. There is no tenderness.  Musculoskeletal: He exhibits no edema.  Lymphadenopathy:    He has no cervical adenopathy.  Neurological: He is alert and oriented to person, place, and time.  Skin: Skin is warm and dry.  Psychiatric: He has a normal mood and affect. His behavior is normal.  Vitals reviewed.   Results for orders placed or performed during the hospital encounter of 06/26/17  Comprehensive metabolic panel  Result Value Ref Range   Sodium 140 135 -  145 mmol/L   Potassium 4.5 3.5 - 5.1 mmol/L   Chloride 103 101 - 111 mmol/L   CO2 24 22 - 32 mmol/L   Glucose, Bld 111 (H) 65 - 99 mg/dL   BUN 7 6 - 20 mg/dL   Creatinine, Ser 4.091.04 0.61 - 1.24 mg/dL   Calcium 9.6 8.9 - 81.110.3 mg/dL   Total Protein 7.3 6.5 - 8.1 g/dL   Albumin 4.4 3.5 - 5.0 g/dL   AST 41 15 - 41 U/L   ALT 59 17 - 63 U/L   Alkaline Phosphatase 54 38 - 126 U/L   Total  Bilirubin 0.9 0.3 - 1.2 mg/dL   GFR calc non Af Amer >60 >60 mL/min   GFR calc Af Amer >60 >60 mL/min   Anion gap 13 5 - 15  Lipid panel  Result Value Ref Range   Cholesterol 156 0 - 200 mg/dL   Triglycerides 914184 (H) <150 mg/dL   HDL 35 (L) >78>40 mg/dL   Total CHOL/HDL Ratio 4.5 RATIO   VLDL 37 0 - 40 mg/dL   LDL Cholesterol 84 0 - 99 mg/dL      Assessment & Plan:    Encounter Diagnoses  Name Primary?  . Hyperlipidemia, unspecified hyperlipidemia type Yes  . Mood disorder (HCC)   . Obesity, unspecified classification, unspecified obesity type, unspecified whether serious comorbidity present   . Chronic back pain, unspecified back location, unspecified back pain laterality     -reviewed labs with pt -pt to continue current medications -[facility] -pt to follow up 4 months.  RTO sooner prn

## 2017-09-28 ENCOUNTER — Other Ambulatory Visit: Payer: Self-pay | Admitting: Physician Assistant

## 2017-10-25 ENCOUNTER — Other Ambulatory Visit (HOSPITAL_COMMUNITY)
Admission: RE | Admit: 2017-10-25 | Discharge: 2017-10-25 | Disposition: A | Discharge status: Home / Self Care | Payer: Self-pay | Source: Ambulatory Visit | Attending: Physician Assistant | Admitting: Physician Assistant

## 2017-10-25 DIAGNOSIS — E785 Hyperlipidemia, unspecified: Secondary | ICD-10-CM | POA: Insufficient documentation

## 2017-10-25 LAB — COMPREHENSIVE METABOLIC PANEL
ALK PHOS: 51 U/L (ref 38–126)
ALT: 50 U/L (ref 17–63)
ANION GAP: 8 (ref 5–15)
AST: 32 U/L (ref 15–41)
Albumin: 4.5 g/dL (ref 3.5–5.0)
BUN: 9 mg/dL (ref 6–20)
CALCIUM: 9.6 mg/dL (ref 8.9–10.3)
CO2: 30 mmol/L (ref 22–32)
Chloride: 102 mmol/L (ref 101–111)
Creatinine, Ser: 1.06 mg/dL (ref 0.61–1.24)
GFR calc non Af Amer: >60 mL/min (ref >60)
Glucose, Bld: 107 mg/dL — ABNORMAL HIGH (ref 65–99)
Potassium: 4.4 mmol/L (ref 3.5–5.1)
SODIUM: 140 mmol/L (ref 135–145)
TOTAL PROTEIN: 7.4 g/dL (ref 6.5–8.1)
Total Bilirubin: 0.9 mg/dL (ref 0.3–1.2)

## 2017-10-25 LAB — LIPID PANEL
CHOL/HDL RATIO: 5 ratio
Cholesterol: 141 mg/dL (ref 0–200)
HDL: 28 mg/dL — AB (ref >40)
LDL Cholesterol: 75 mg/dL (ref 0–99)
Triglycerides: 188 mg/dL — ABNORMAL HIGH (ref <150)
VLDL: 38 mg/dL (ref 0–40)

## 2017-11-01 ENCOUNTER — Ambulatory Visit: Payer: Self-pay | Admitting: Physician Assistant

## 2017-11-01 ENCOUNTER — Other Ambulatory Visit: Payer: Self-pay | Admitting: Physician Assistant

## 2017-11-01 ENCOUNTER — Encounter: Payer: Self-pay | Admitting: Physician Assistant

## 2017-11-01 VITALS — BP 124/72 | HR 73 | Temp 97.9°F | Ht 72.5 in | Wt 284.5 lb

## 2017-11-01 DIAGNOSIS — Z125 Encounter for screening for malignant neoplasm of prostate: Secondary | ICD-10-CM

## 2017-11-01 DIAGNOSIS — Z1211 Encounter for screening for malignant neoplasm of colon: Secondary | ICD-10-CM

## 2017-11-01 DIAGNOSIS — E669 Obesity, unspecified: Secondary | ICD-10-CM

## 2017-11-01 DIAGNOSIS — E785 Hyperlipidemia, unspecified: Secondary | ICD-10-CM

## 2017-11-01 DIAGNOSIS — F39 Unspecified mood [affective] disorder: Secondary | ICD-10-CM

## 2017-11-01 NOTE — Progress Notes (Unsigned)
i

## 2017-11-01 NOTE — Progress Notes (Signed)
BP 124/72 (BP Location: Left Arm, Patient Position: Sitting, Cuff Size: Normal)   Pulse 73   Temp 97.9 F (36.6 C)   Ht 6' 0.5" (1.842 m)   Wt 284 lb 8 oz (129 kg)   SpO2 96%   BMI 38.05 kg/m    Subjective:    Patient ID: Malik Blake Lapine, male    DOB: 11/15/1965, 52 y.o.   MRN: [id number]  HPI: Malik Blake Mynhier is a 52 y.o. male presenting on 11/01/2017 for Hyperlipidemia   HPI   [facility]  He is doing well and has no complaints today  Relevant past medical, surgical, family and social history reviewed and updated as indicated. Interim medical history since our last visit reviewed. Allergies and medications reviewed and updated.   Current Outpatient Medications:  .  lurasidone (LATUDA) 40 MG TABS tablet, Take 60 mg by mouth daily with breakfast. , Disp: , Rfl:  .  Omega-3 Fatty Acids (FISH OIL PO), Take 2 tablets by mouth 2 (two) times daily. , Disp: , Rfl:  .  simvastatin (ZOCOR) 20 MG tablet, TAKE 1 TABLET BY MOUTH AT BEDTIME, Disp: 30 tablet, Rfl: 4   Review of Systems  Constitutional: Negative for appetite change, chills, diaphoresis, fatigue, fever and unexpected weight change.  HENT: Negative for congestion, dental problem, drooling, ear pain, facial swelling, hearing loss, mouth sores, sneezing, sore throat, trouble swallowing and voice change.   Eyes: Negative for pain, discharge, redness, itching and visual disturbance.  Respiratory: Negative for cough, choking, shortness of breath and wheezing.   Cardiovascular: Negative for chest pain, palpitations and leg swelling.  Gastrointestinal: Negative for abdominal pain, blood in stool, constipation, diarrhea and vomiting.  Endocrine: Negative for cold intolerance, heat intolerance and polydipsia.  Genitourinary: Negative for decreased urine volume, dysuria and hematuria.  Musculoskeletal: Positive for back pain. Negative for arthralgias and gait problem.  Skin: Negative for rash.  Allergic/Immunologic:  Positive for environmental allergies.  Neurological: Negative for seizures, syncope, light-headedness and headaches.  Hematological: Negative for adenopathy.  Psychiatric/Behavioral: Positive for agitation. Negative for dysphoric mood and suicidal ideas. The patient is not nervous/anxious.     Per HPI unless specifically indicated above     Objective:    BP 124/72 (BP Location: Left Arm, Patient Position: Sitting, Cuff Size: Normal)   Pulse 73   Temp 97.9 F (36.6 C)   Ht 6' 0.5" (1.842 m)   Wt 284 lb 8 oz (129 kg)   SpO2 96%   BMI 38.05 kg/m   Wt Readings from Last 3 Encounters:  11/01/17 284 lb 8 oz (129 kg)  07/05/17 292 lb (132.5 kg)  06/06/17 291 lb 8 oz (132.2 kg)    Physical Exam  Constitutional: He is oriented to person, place, and time. He appears well-developed and well-nourished.  HENT:  Head: Normocephalic and atraumatic.  Neck: Neck supple.  Cardiovascular: Normal rate and regular rhythm.  Pulmonary/Chest: Effort normal and breath sounds normal. He has no wheezes.  Abdominal: Soft. Bowel sounds are normal. There is no hepatosplenomegaly. There is no tenderness.  Musculoskeletal: He exhibits no edema.  Lymphadenopathy:    He has no cervical adenopathy.  Neurological: He is alert and oriented to person, place, and time.  Skin: Skin is warm and dry.  Psychiatric: He has a normal mood and affect. His behavior is normal.  Vitals reviewed.   Results for orders placed or performed during the hospital encounter of 10/25/17  Lipid panel  Result Value Ref Range   Cholesterol 141 0 - 200 mg/dL   Triglycerides 098 (H) <150 mg/dL   HDL 28 (L) >11 mg/dL   Total CHOL/HDL Ratio 5.0 RATIO   VLDL 38 0 - 40 mg/dL   LDL Cholesterol 75 0 - 99 mg/dL  Comprehensive metabolic panel  Result Value Ref Range   Sodium 140 135 - 145 mmol/L   Potassium 4.4 3.5 - 5.1 mmol/L   Chloride 102 101 - 111 mmol/L   CO2 30 22 - 32 mmol/L   Glucose, Bld 107 (H) 65 - 99 mg/dL   BUN 9 6 -  20 mg/dL   Creatinine, Ser 9.14 0.61 - 1.24 mg/dL   Calcium 9.6 8.9 - 78.2 mg/dL   Total Protein 7.4 6.5 - 8.1 g/dL   Albumin 4.5 3.5 - 5.0 g/dL   AST 32 15 - 41 U/L   ALT 50 17 - 63 U/L   Alkaline Phosphatase 51 38 - 126 U/L   Total Bilirubin 0.9 0.3 - 1.2 mg/dL   GFR calc non Af Amer >60 >60 mL/min   GFR calc Af Amer >60 >60 mL/min   Anion gap 8 5 - 15      Assessment & Plan:   Encounter Diagnoses  Name Primary?  . Hyperlipidemia, unspecified hyperlipidemia type Yes  . Mood disorder (HCC)   . Obesity, unspecified classification, unspecified obesity type, unspecified whether serious comorbidity present   . Screening for colon cancer   . Screening for prostate cancer      -reviewed labs with pt -pt was given iFOBT for colon cancer screening -Pt to continue current medications -pt to continue with [facility] -pt to follow up 4 months.  RTO sooner prn

## 2017-11-08 LAB — IFOBT (OCCULT BLOOD): IMMUNOLOGICAL FECAL OCCULT BLOOD TEST: NEGATIVE

## 2018-01-02 ENCOUNTER — Other Ambulatory Visit: Payer: Self-pay | Admitting: Physician Assistant

## 2018-01-02 MED ORDER — SIMVASTATIN 20 MG PO TABS: 20.0000 mg | ORAL_TABLET | Freq: Every day | ORAL | 6 refills | Status: DC

## 2018-02-23 ENCOUNTER — Other Ambulatory Visit (HOSPITAL_COMMUNITY)
Admission: RE | Admit: 2018-02-23 | Discharge: 2018-02-23 | Disposition: A | Discharge status: Home / Self Care | Payer: Self-pay | Source: Ambulatory Visit | Attending: Physician Assistant | Admitting: Physician Assistant

## 2018-02-23 DIAGNOSIS — Z125 Encounter for screening for malignant neoplasm of prostate: Secondary | ICD-10-CM | POA: Insufficient documentation

## 2018-02-23 DIAGNOSIS — E785 Hyperlipidemia, unspecified: Secondary | ICD-10-CM | POA: Insufficient documentation

## 2018-02-23 LAB — COMPREHENSIVE METABOLIC PANEL
ALBUMIN: 4.4 g/dL (ref 3.5–5.0)
ALT: 44 U/L (ref 0–44)
AST: 26 U/L (ref 15–41)
Alkaline Phosphatase: 54 U/L (ref 38–126)
Anion gap: 6 (ref 5–15)
BILIRUBIN TOTAL: 0.8 mg/dL (ref 0.3–1.2)
BUN: 10 mg/dL (ref 6–20)
CALCIUM: 9.6 mg/dL (ref 8.9–10.3)
CHLORIDE: 106 mmol/L (ref 98–111)
CO2: 29 mmol/L (ref 22–32)
Creatinine, Ser: 1.07 mg/dL (ref 0.61–1.24)
GFR calc Af Amer: >60 mL/min (ref >60)
Glucose, Bld: 109 mg/dL — ABNORMAL HIGH (ref 70–99)
POTASSIUM: 4.3 mmol/L (ref 3.5–5.1)
Sodium: 141 mmol/L (ref 135–145)
Total Protein: 7.2 g/dL (ref 6.5–8.1)

## 2018-02-23 LAB — LIPID PANEL
CHOLESTEROL: 142 mg/dL (ref 0–200)
HDL: 31 mg/dL — AB (ref >40)
LDL CALC: 77 mg/dL (ref 0–99)
TRIGLYCERIDES: 170 mg/dL — AB (ref <150)
Total CHOL/HDL Ratio: 4.6 RATIO
VLDL: 34 mg/dL (ref 0–40)

## 2018-02-23 LAB — PSA: Prostatic Specific Antigen: 2.53 ng/mL (ref 0.00–4.00)

## 2018-02-28 ENCOUNTER — Ambulatory Visit: Payer: Self-pay | Admitting: Physician Assistant

## 2018-02-28 ENCOUNTER — Encounter: Payer: Self-pay | Admitting: Physician Assistant

## 2018-02-28 VITALS — BP 110/70 | HR 70 | Temp 98.1°F | Ht 72.5 in | Wt 280.0 lb

## 2018-02-28 DIAGNOSIS — F39 Unspecified mood [affective] disorder: Secondary | ICD-10-CM

## 2018-02-28 DIAGNOSIS — E785 Hyperlipidemia, unspecified: Secondary | ICD-10-CM

## 2018-02-28 DIAGNOSIS — E669 Obesity, unspecified: Secondary | ICD-10-CM

## 2018-02-28 MED ORDER — SIMVASTATIN 20 MG PO TABS: 20.0000 mg | ORAL_TABLET | Freq: Every day | ORAL | 6 refills | Status: DC

## 2018-02-28 NOTE — Progress Notes (Signed)
BP 110/70 (BP Location: Left Arm, Patient Position: Sitting, Cuff Size: Normal)   Pulse 70   Temp 98.1 F (36.7 C)   Ht 6' 0.5" (1.842 m)   Wt 280 lb (127 kg)   SpO2 96%   BMI 37.45 kg/m    Subjective:    Patient ID: Malik Blake, male    DOB: 1966/01/10, 52 y.o.   MRN: [id number]  HPI: Malik Blake is a 52 y.o. male presenting on 02/28/2018 for Hyperlipidemia   HPI   Pt is still going to [facility] (he sees Carly)  Pt says he is doing fine and he has no complaints today  Relevant past medical, surgical, family and social history reviewed and updated as indicated. Interim medical history since our last visit reviewed. Allergies and medications reviewed and updated.    Current Outpatient Medications:  .  Lurasidone HCl (LATUDA) 60 MG TABS, Take 1 tablet by mouth daily., Disp: , Rfl:  .  Omega-3 Fatty Acids (FISH OIL PO), Take 2 tablets by mouth at bedtime. , Disp: , Rfl:  .  simvastatin (ZOCOR) 20 MG tablet, Take 1 tablet (20 mg total) by mouth at bedtime., Disp: 30 tablet, Rfl: 6   Review of Systems  Constitutional: Negative for appetite change, chills, diaphoresis, fatigue, fever and unexpected weight change.  HENT: Negative for congestion, dental problem, drooling, ear pain, facial swelling, hearing loss, mouth sores, sneezing, sore throat, trouble swallowing and voice change.   Eyes: Negative for pain, discharge, redness, itching and visual disturbance.  Respiratory: Negative for cough, choking, shortness of breath and wheezing.   Cardiovascular: Negative for chest pain, palpitations and leg swelling.  Gastrointestinal: Negative for abdominal pain, blood in stool, constipation, diarrhea and vomiting.  Endocrine: Negative for cold intolerance, heat intolerance and polydipsia.  Genitourinary: Negative for decreased urine volume, dysuria and hematuria.  Musculoskeletal: Positive for arthralgias and back pain. Negative for gait problem.  Skin: Negative for rash.   Allergic/Immunologic: Positive for environmental allergies.  Neurological: Negative for seizures, syncope, light-headedness and headaches.  Hematological: Negative for adenopathy.  Psychiatric/Behavioral: Negative for agitation, dysphoric mood and suicidal ideas. The patient is not nervous/anxious.     Per HPI unless specifically indicated above     Objective:    BP 110/70 (BP Location: Left Arm, Patient Position: Sitting, Cuff Size: Normal)   Pulse 70   Temp 98.1 F (36.7 C)   Ht 6' 0.5" (1.842 m)   Wt 280 lb (127 kg)   SpO2 96%   BMI 37.45 kg/m   Wt Readings from Last 3 Encounters:  02/28/18 280 lb (127 kg)  11/01/17 284 lb 8 oz (129 kg)  07/05/17 292 lb (132.5 kg)    Physical Exam  Constitutional: He is oriented to person, place, and time. He appears well-developed and well-nourished.  HENT:  Head: Normocephalic and atraumatic.  Neck: Neck supple.  Cardiovascular: Normal rate and regular rhythm.  Pulmonary/Chest: Effort normal and breath sounds normal. He has no wheezes.  Abdominal: Soft. Bowel sounds are normal. There is no hepatosplenomegaly. There is no tenderness.  Musculoskeletal: He exhibits no edema.  Lymphadenopathy:    He has no cervical adenopathy.  Neurological: He is alert and oriented to person, place, and time.  Skin: Skin is warm and dry.  Psychiatric: He has a normal mood and affect. His behavior is normal.  Vitals reviewed.   Results for orders placed or performed during the hospital encounter of 02/23/18  PSA  Result Value Ref Range  Prostatic Specific Antigen 2.53 0.00 - 4.00 ng/mL  Comprehensive metabolic panel  Result Value Ref Range   Sodium 141 135 - 145 mmol/L   Potassium 4.3 3.5 - 5.1 mmol/L   Chloride 106 98 - 111 mmol/L   CO2 29 22 - 32 mmol/L   Glucose, Bld 109 (H) 70 - 99 mg/dL   BUN 10 6 - 20 mg/dL   Creatinine, Ser 1.61 0.61 - 1.24 mg/dL   Calcium 9.6 8.9 - 09.6 mg/dL   Total Protein 7.2 6.5 - 8.1 g/dL   Albumin 4.4 3.5 -  5.0 g/dL   AST 26 15 - 41 U/L   ALT 44 0 - 44 U/L   Alkaline Phosphatase 54 38 - 126 U/L   Total Bilirubin 0.8 0.3 - 1.2 mg/dL   GFR calc non Af Amer >60 >60 mL/min   GFR calc Af Amer >60 >60 mL/min   Anion gap 6 5 - 15  Lipid panel  Result Value Ref Range   Cholesterol 142 0 - 200 mg/dL   Triglycerides 045 (H) <150 mg/dL   HDL 31 (L) >40 mg/dL   Total CHOL/HDL Ratio 4.6 RATIO   VLDL 34 0 - 40 mg/dL   LDL Cholesterol 77 0 - 99 mg/dL      Assessment & Plan:   Encounter Diagnoses  Name Primary?  . Hyperlipidemia, unspecified hyperlipidemia type Yes  . Mood disorder (HCC)   . Obesity, unspecified classification, unspecified obesity type, unspecified whether serious comorbidity present      -reviewed labs with pt  -pt to continue current medications -pt to continue with [facility] for MH issues -pt to follow up here 6 months.  RTO sooner prn

## 2018-05-31 ENCOUNTER — Ambulatory Visit: Payer: Self-pay | Admitting: Physician Assistant

## 2018-05-31 ENCOUNTER — Encounter: Payer: Self-pay | Admitting: Physician Assistant

## 2018-05-31 VITALS — BP 120/86 | HR 75 | Temp 97.9°F | Ht 72.5 in | Wt 281.5 lb

## 2018-05-31 DIAGNOSIS — G8929 Other chronic pain: Secondary | ICD-10-CM

## 2018-05-31 DIAGNOSIS — M545 Low back pain: Principal | ICD-10-CM

## 2018-05-31 DIAGNOSIS — R079 Chest pain, unspecified: Secondary | ICD-10-CM

## 2018-05-31 NOTE — Progress Notes (Signed)
BP 120/86 (BP Location: Left Arm, Patient Position: Sitting, Cuff Size: Normal)   Pulse 75   Temp 97.9 F (36.6 C)   Ht 6' 0.5" (1.842 m)   Wt 281 lb 8 oz (127.7 kg)   SpO2 97%   BMI 37.65 kg/m    Subjective:    Patient ID: Malik Blake, male    DOB: December 22, 1965, 53 y.o.   MRN: [id number]  HPI: Malik Blake is a 53 y.o. male presenting on 05/31/2018 for Back Pain ("has always hurt" for about 12 years. pt states a few nights ago has not been able to sleep because of back pain. pt takes OTC pain relief which sometimes helps.)   HPI   Chief Complaint  Patient presents with  . Back Pain    "has always hurt" for about 12 years. pt states a few nights ago has not been able to sleep because of back pain. pt takes OTC pain relief which sometimes helps.    Pt was in [employer] work 12 years ago.  He says he had to stop working 12 years ago due to back pain.  Pt denies any acute injury at the time, says it was just the day to day of the work that made his back hurt.   Pt says back pain not any worse now than it was when he had CT 2016.    Pt states that Standing too long hurts.  He also says Bending or lifting hurts.   As an aside, pt says he had CP last week afer he had had been drinking a lot of [place].  It came and went for 2 or 3 days.  He stopped the [facility] and the pain stopped.  He will occasionally have a pepsi but he avoids [employer].   He denies SOB, no sweating, no nausea, no LUE pain.     Pt says that [facility] orthopedics did MRI about 12 years ago-  Says they told him there was nothing they could do for him, that he had DDD and bulging discs.    Pt was seen by dr Hardie Lora at that same place in 2016.   Pt says "not really" when asked if he ever did physical therapy.   He says he saw a [facility].  He says that seemed to help some.    Pt says he feels like he needs to complain some about his back pain because his ssi is getting re-evaluated soon and he is worried it will be taken  away if he doesn't "complain about it some".   Pt denies radiation of pain into the lower extremities.  No numbness, weakness, tingling or incontenence reported.  Relevant past medical, surgical, family and social history reviewed and updated as indicated. Interim medical history since our last visit reviewed. Allergies and medications reviewed and updated.   Current Outpatient Medications:  .  Lurasidone HCl (LATUDA) 60 MG TABS, Take 1 tablet by mouth daily., Disp: , Rfl:  .  Omega-3 Fatty Acids (FISH OIL PO), Take 2 tablets by mouth at bedtime. , Disp: , Rfl:  .  simvastatin (ZOCOR) 20 MG tablet, Take 1 tablet (20 mg total) by mouth at bedtime., Disp: 30 tablet, Rfl: 6    Review of Systems  Constitutional: Negative for appetite change, chills, diaphoresis, fatigue, fever and unexpected weight change.  HENT: Positive for dental problem and drooling. Negative for congestion, ear pain, facial swelling, hearing loss, mouth sores, sneezing, sore throat, trouble  swallowing and voice change.   Eyes: Positive for pain, itching and visual disturbance. Negative for discharge and redness.  Respiratory: Negative for cough, choking, shortness of breath and wheezing.   Cardiovascular: Negative for chest pain, palpitations and leg swelling.  Gastrointestinal: Positive for abdominal pain. Negative for blood in stool, constipation, diarrhea and vomiting.  Endocrine: Negative for cold intolerance, heat intolerance and polydipsia.  Genitourinary: Negative for decreased urine volume, dysuria and hematuria.  Musculoskeletal: Positive for arthralgias and back pain. Negative for gait problem.  Skin: Negative for rash.  Allergic/Immunologic: Positive for environmental allergies.  Neurological: Negative for seizures, syncope, light-headedness and headaches.  Hematological: Negative for adenopathy.  Psychiatric/Behavioral: Negative for agitation, dysphoric mood and suicidal ideas. The patient is nervous/anxious.      Per HPI unless specifically indicated above     Objective:    BP 120/86 (BP Location: Left Arm, Patient Position: Sitting, Cuff Size: Normal)   Pulse 75   Temp 97.9 F (36.6 C)   Ht 6' 0.5" (1.842 m)   Wt 281 lb 8 oz (127.7 kg)   SpO2 97%   BMI 37.65 kg/m   Wt Readings from Last 3 Encounters:  05/31/18 281 lb 8 oz (127.7 kg)  02/28/18 280 lb (127 kg)  11/01/17 284 lb 8 oz (129 kg)    Physical Exam Vitals signs reviewed.  Constitutional:      Appearance: He is well-developed.  HENT:     Head: Normocephalic and atraumatic.  Neck:     Musculoskeletal: Neck supple.  Cardiovascular:     Rate and Rhythm: Normal rate and regular rhythm.  Pulmonary:     Effort: Pulmonary effort is normal.     Breath sounds: Normal breath sounds. No wheezing.  Abdominal:     General: Bowel sounds are normal.     Palpations: Abdomen is soft.     Tenderness: There is no abdominal tenderness.  Musculoskeletal:     Lumbar back: He exhibits normal range of motion, no tenderness, no bony tenderness and no swelling.     Comments: SLR negative  Lymphadenopathy:     Cervical: No cervical adenopathy.  Skin:    General: Skin is warm and dry.  Neurological:     Mental Status: He is alert and oriented to person, place, and time.  Psychiatric:        Behavior: Behavior normal.      EKG - SB at 59.  No ST-T changes. No previous for comparison     Assessment & Plan:    Encounter Diagnoses  Name Primary?  . Chronic bilateral low back pain without sciatica Yes  . Chest pain, unspecified type     -discussed CP with pt.  Urged him to avoid [employer] (and energy drinks).  Limit all sodas.  He is to RTO if the CP returns -pt was given [facility] application -will get xray LS spine.   -records from most recent OV with dr Shelle Iron (orthopedist) requested. -discussed possible physical therapy versus MRI versus re-referral to orthopedist for pain -pt has routine follow up scheduled

## 2018-06-01 ENCOUNTER — Ambulatory Visit (HOSPITAL_COMMUNITY)
Admission: RE | Admit: 2018-06-01 | Discharge: 2018-06-01 | Disposition: A | Discharge status: Home / Self Care | Payer: Self-pay | Source: Ambulatory Visit | Attending: Physician Assistant | Admitting: Physician Assistant

## 2018-06-01 DIAGNOSIS — G8929 Other chronic pain: Secondary | ICD-10-CM | POA: Insufficient documentation

## 2018-06-01 DIAGNOSIS — M545 Low back pain: Secondary | ICD-10-CM | POA: Insufficient documentation

## 2018-06-27 ENCOUNTER — Encounter: Payer: Self-pay | Admitting: Physician Assistant

## 2018-06-27 ENCOUNTER — Ambulatory Visit: Payer: Self-pay | Admitting: Physician Assistant

## 2018-06-27 VITALS — BP 100/70 | HR 96 | Temp 99.1°F | Ht 72.5 in | Wt 275.0 lb

## 2018-06-27 DIAGNOSIS — J00 Acute nasopharyngitis [common cold]: Secondary | ICD-10-CM

## 2018-06-27 NOTE — Progress Notes (Signed)
BP 100/70 (BP Location: Left Arm, Patient Position: Sitting, Cuff Size: Normal)   Pulse 96   Temp 99.1 F (37.3 C) (Oral)   Ht 6' 0.5" (1.842 m)   Wt 275 lb (124.7 kg)   SpO2 95%   BMI 36.78 kg/m    Subjective:    Patient ID: Malik Blake, male    DOB: December 30, 1965, 53 y.o.   MRN: [id number]  HPI: Malik Blake is a 53 y.o. male presenting on 06/27/2018 for Cough (sx began yesterday, runny nose, cough, body aches, congestion, mucous, HA, Sub fever. pt has not taken anything for sx.)   HPI   Chief Complaint  Patient presents with  . Cough    sx began yesterday, runny nose, cough, body aches, congestion, mucous, HA, Sub fever. pt has not taken anything for sx.     Pt took some OTC lst night that helped.  Nothing for symptoms today.  Pt is very worried about coronavirus that has been reported on the news lately.  Pt has not traveled recently nor has he been around anyone who has.    Relevant past medical, surgical, family and social history reviewed and updated as indicated. Interim medical history since our last visit reviewed. Allergies and medications reviewed and updated.   Current Outpatient Medications:  .  Lurasidone HCl (LATUDA) 60 MG TABS, Take 1 tablet by mouth daily., Disp: , Rfl:  .  Omega-3 Fatty Acids (FISH OIL PO), Take 2 tablets by mouth at bedtime. , Disp: , Rfl:  .  simvastatin (ZOCOR) 20 MG tablet, Take 1 tablet (20 mg total) by mouth at bedtime., Disp: 30 tablet, Rfl: 6    Review of Systems  Constitutional: Positive for chills and fatigue. Negative for appetite change, diaphoresis, fever and unexpected weight change.  HENT: Positive for congestion, drooling and sneezing. Negative for dental problem, ear pain, facial swelling, hearing loss, mouth sores, sore throat, trouble swallowing and voice change.   Eyes: Positive for pain. Negative for discharge, redness, itching and visual disturbance.  Respiratory: Positive for cough. Negative for choking, shortness  of breath and wheezing.   Cardiovascular: Negative for chest pain, palpitations and leg swelling.  Gastrointestinal: Negative for abdominal pain, blood in stool, constipation, diarrhea and vomiting.  Endocrine: Negative for cold intolerance, heat intolerance and polydipsia.  Genitourinary: Positive for decreased urine volume. Negative for dysuria and hematuria.  Musculoskeletal: Positive for arthralgias and back pain. Negative for gait problem.  Skin: Negative for rash.  Allergic/Immunologic: Positive for environmental allergies.  Neurological: Positive for headaches. Negative for seizures, syncope and light-headedness.  Hematological: Negative for adenopathy.  Psychiatric/Behavioral: Positive for agitation. Negative for dysphoric mood and suicidal ideas. The patient is nervous/anxious.     Per HPI unless specifically indicated above     Objective:    BP 100/70 (BP Location: Left Arm, Patient Position: Sitting, Cuff Size: Normal)   Pulse 96   Temp 99.1 F (37.3 C) (Oral)   Ht 6' 0.5" (1.842 m)   Wt 275 lb (124.7 kg)   SpO2 95%   BMI 36.78 kg/m   Wt Readings from Last 3 Encounters:  06/27/18 275 lb (124.7 kg)  05/31/18 281 lb 8 oz (127.7 kg)  02/28/18 280 lb (127 kg)    Physical Exam Vitals signs reviewed.  Constitutional:      Appearance: He is well-developed.  HENT:     Head: Normocephalic and atraumatic.     Right Ear: Hearing, tympanic membrane, ear canal and external  ear normal.     Left Ear: Hearing, tympanic membrane, ear canal and external ear normal.     Nose: Nose normal.     Mouth/Throat:     Pharynx: Uvula midline. No oropharyngeal exudate, posterior oropharyngeal erythema or uvula swelling.     Tonsils: No tonsillar abscesses.  Neck:     Musculoskeletal: Neck supple.  Cardiovascular:     Rate and Rhythm: Normal rate and regular rhythm.  Pulmonary:     Effort: Pulmonary effort is normal.     Breath sounds: Normal breath sounds.  Lymphadenopathy:      Cervical: No cervical adenopathy.  Skin:    General: Skin is warm and dry.  Neurological:     Mental Status: He is alert and oriented to person, place, and time.  Psychiatric:        Behavior: Behavior normal.         Assessment & Plan:      Encounter Diagnosis  Name Primary?  . Acute nasopharyngitis Yes    -reassured pt -encouraged pt to use OTCs prn to help with symptoms.  Rest, fluids. -follow up as scheduled.  RTO sooner prn

## 2018-06-27 NOTE — Patient Instructions (Signed)
Viral Respiratory Infection  A respiratory infection is an illness that affects part of the respiratory system, such as the lungs, nose, or throat. A respiratory infection that is caused by a virus is called a viral respiratory infection.  Common types of viral respiratory infections include:  · A cold.  · The flu (influenza).  · A respiratory syncytial virus (RSV) infection.  What are the causes?  This condition is caused by a virus.  What are the signs or symptoms?  Symptoms of this condition include:  · A stuffy or runny nose.  · Yellow or green nasal discharge.  · A cough.  · Sneezing.  · Fatigue.  · Achy muscles.  · A sore throat.  · Sweating or chills.  · A fever.  · A headache.  How is this diagnosed?  This condition may be diagnosed based on:  · Your symptoms.  · A physical exam.  · Testing of nasal swabs.  How is this treated?  This condition may be treated with medicines, such as:  · Antiviral medicine. This may shorten the length of time a person has symptoms.  · Expectorants. These make it easier to cough up mucus.  · Decongestant nasal sprays.  · Acetaminophen or NSAIDs to relieve fever and pain.  Antibiotic medicines are not prescribed for viral infections. This is because antibiotics are designed to kill bacteria. They are not effective against viruses.  Follow these instructions at home:    Managing pain and congestion  · Take over-the-counter and prescription medicines only as told by your health care provider.  · If you have a sore throat, gargle with a salt-water mixture 3-4 times a day or as needed. To make a salt-water mixture, completely dissolve ½-1 tsp of salt in 1 cup of warm water.  · Use nose drops made from salt water to ease congestion and soften raw skin around your nose.  · Drink enough fluid to keep your urine pale yellow. This helps prevent dehydration and helps loosen up mucus.  General instructions  · Rest as much as possible.  · Do not drink alcohol.  · Do not use any products  that contain nicotine or tobacco, such as cigarettes and e-cigarettes. If you need help quitting, ask your health care provider.  · Keep all follow-up visits as told by your health care provider. This is important.  How is this prevented?    · Get an annual flu shot. You may get the flu shot in late summer, fall, or winter. Ask your health care provider when you should get your flu shot.  · Avoid exposing others to your respiratory infection.  ? Stay home from work or school as told by your health care provider.  ? Wash your hands with soap and water often, especially after you cough or sneeze. If soap and water are not available, use alcohol-based hand sanitizer.  · Avoid contact with people who are sick during cold and flu season. This is generally fall and winter.  Contact a health care provider if:  · Your symptoms last for 10 days or longer.  · Your symptoms get worse over time.  · You have a fever.  · You have severe sinus pain in your face or forehead.  · The glands in your jaw or neck become very swollen.  Get help right away if you:  · Feel pain or pressure in your chest.  · Have shortness of breath.  · Faint or feel like   you will faint.  · Have severe and persistent vomiting.  · Feel confused or disoriented.  Summary  · A respiratory infection is an illness that affects part of the respiratory system, such as the lungs, nose, or throat. A respiratory infection that is caused by a virus is called a viral respiratory infection.  · Common types of viral respiratory infections are a cold, influenza, and respiratory syncytial virus (RSV) infection.  · Symptoms of this condition include a stuffy or runny nose, cough, sneezing, fatigue, achy muscles, sore throat, and fevers or chills.  · Antibiotic medicines are not prescribed for viral infections. This is because antibiotics are designed to kill bacteria. They are not effective against viruses.  This information is not intended to replace advice given to you by  your health care provider. Make sure you discuss any questions you have with your health care provider.  Document Released: 02/23/2005 Document Revised: 06/26/2017 Document Reviewed: 06/26/2017  Elsevier Interactive Patient Education © 2019 [employer] Inc[employer]

## 2018-08-21 ENCOUNTER — Ambulatory Visit: Payer: Self-pay | Admitting: Physician Assistant

## 2018-08-21 DIAGNOSIS — M545 Low back pain, unspecified: Secondary | ICD-10-CM

## 2018-08-21 DIAGNOSIS — G8929 Other chronic pain: Secondary | ICD-10-CM

## 2018-08-21 DIAGNOSIS — E785 Hyperlipidemia, unspecified: Secondary | ICD-10-CM

## 2018-08-21 NOTE — Progress Notes (Signed)
   There were no vitals taken for this visit.   Subjective:    Patient ID: Malik Blake, male    DOB: 1966-05-27, 53 y.o.   MRN: [id number]  HPI: Malik Blake is a 53 y.o. male presenting on 08/21/2018 for No chief complaint on file.   HPI   This is a telemedicine visit via telephone due to the coronavirus pandemic  Pt's Last OV in January he complained about his back- At that time pt was to think about PT versus MRI versus re-referral to orthopedics  Pt says he has not yet turned in his application for financial assistance due to awaiting paperwork from the government  Pt says he is doing well otherwise.  He denies cough, fever, sob.   Relevant past medical, surgical, family and social history reviewed and updated as indicated. Interim medical history since our last visit reviewed. Allergies and medications reviewed and updated.   Current Outpatient Medications:  .  Lurasidone HCl (LATUDA) 60 MG TABS, Take 1 tablet by mouth daily., Disp: , Rfl:  .  Omega-3 Fatty Acids (FISH OIL PO), Take 2 tablets by mouth at bedtime. , Disp: , Rfl:  .  simvastatin (ZOCOR) 20 MG tablet, Take 1 tablet (20 mg total) by mouth at bedtime., Disp: 30 tablet, Rfl: 6   Review of Systems  Per HPI unless specifically indicated above     Objective:    There were no vitals taken for this visit.  Wt Readings from Last 3 Encounters:  06/27/18 275 lb (124.7 kg)  05/31/18 281 lb 8 oz (127.7 kg)  02/28/18 280 lb (127 kg)    Physical Exam Pulmonary:     Effort: Pulmonary effort is normal. No respiratory distress.  Neurological:     Mental Status: He is alert and oriented to person, place, and time.  Psychiatric:        Mood and Affect: Mood normal.         Assessment & Plan:   Encounter Diagnoses  Name Primary?  . Chronic bilateral low back pain without sciatica Yes  . Hyperlipidemia, unspecified hyperlipidemia type     -Reviewed back xray with pt -Pt is counseled to get his  application for [facility] turned in -He says he is leaning to PT "if anything" after he gets approved.  He specifically says he doesn't want to return to ortho at this time because there is nothing to do for that -Will refax record request to [place] [facility] -labs deferred at this time due to coronavirus -will schedule follow up for 6 week.  Pt to contact office sooner if needed.

## 2018-08-29 ENCOUNTER — Ambulatory Visit: Payer: Self-pay | Admitting: Physician Assistant

## 2018-10-03 ENCOUNTER — Ambulatory Visit: Payer: Self-pay | Admitting: Physician Assistant

## 2018-10-18 ENCOUNTER — Other Ambulatory Visit: Payer: Self-pay | Admitting: Physician Assistant

## 2018-10-18 DIAGNOSIS — E785 Hyperlipidemia, unspecified: Secondary | ICD-10-CM

## 2018-10-30 ENCOUNTER — Other Ambulatory Visit: Payer: Self-pay

## 2018-10-30 ENCOUNTER — Other Ambulatory Visit (HOSPITAL_COMMUNITY)
Admission: RE | Admit: 2018-10-30 | Discharge: 2018-10-30 | Disposition: A | Discharge status: Home / Self Care | Payer: Self-pay | Source: Ambulatory Visit | Attending: Physician Assistant | Admitting: Physician Assistant

## 2018-10-30 DIAGNOSIS — E785 Hyperlipidemia, unspecified: Secondary | ICD-10-CM | POA: Insufficient documentation

## 2018-10-30 LAB — COMPREHENSIVE METABOLIC PANEL
ALT: 51 U/L — ABNORMAL HIGH (ref 0–44)
AST: 32 U/L (ref 15–41)
Albumin: 4.3 g/dL (ref 3.5–5.0)
Alkaline Phosphatase: 52 U/L (ref 38–126)
Anion gap: 9 (ref 5–15)
BUN: 12 mg/dL (ref 6–20)
CO2: 26 mmol/L (ref 22–32)
Calcium: 9.5 mg/dL (ref 8.9–10.3)
Chloride: 107 mmol/L (ref 98–111)
Creatinine, Ser: 1 mg/dL (ref 0.61–1.24)
GFR calc Af Amer: >60 mL/min (ref >60)
GFR calc non Af Amer: >60 mL/min (ref >60)
Glucose, Bld: 103 mg/dL — ABNORMAL HIGH (ref 70–99)
Potassium: 4.2 mmol/L (ref 3.5–5.1)
Sodium: 142 mmol/L (ref 135–145)
Total Bilirubin: 0.6 mg/dL (ref 0.3–1.2)
Total Protein: 7.2 g/dL (ref 6.5–8.1)

## 2018-10-30 LAB — LIPID PANEL
Cholesterol: 195 mg/dL (ref 0–200)
HDL: 34 mg/dL — ABNORMAL LOW (ref >40)
LDL Cholesterol: 131 mg/dL — ABNORMAL HIGH (ref 0–99)
Total CHOL/HDL Ratio: 5.7 RATIO
Triglycerides: 149 mg/dL (ref <150)
VLDL: 30 mg/dL (ref 0–40)

## 2018-11-06 ENCOUNTER — Encounter: Payer: Self-pay | Admitting: Physician Assistant

## 2018-11-06 ENCOUNTER — Ambulatory Visit: Payer: Self-pay | Admitting: Physician Assistant

## 2018-11-06 ENCOUNTER — Other Ambulatory Visit: Payer: Self-pay | Admitting: Physician Assistant

## 2018-11-06 ENCOUNTER — Other Ambulatory Visit: Payer: Self-pay

## 2018-11-06 VITALS — BP 112/78 | HR 80 | Temp 98.1°F | Wt 277.2 lb

## 2018-11-06 DIAGNOSIS — E785 Hyperlipidemia, unspecified: Secondary | ICD-10-CM

## 2018-11-06 DIAGNOSIS — Z1211 Encounter for screening for malignant neoplasm of colon: Secondary | ICD-10-CM

## 2018-11-06 DIAGNOSIS — Z125 Encounter for screening for malignant neoplasm of prostate: Secondary | ICD-10-CM

## 2018-11-06 MED ORDER — SIMVASTATIN 20 MG PO TABS: 20.0000 mg | ORAL_TABLET | Freq: Every day | ORAL | 7 refills | Status: DC

## 2018-11-06 NOTE — Patient Instructions (Signed)

## 2018-11-06 NOTE — Progress Notes (Signed)
BP 112/78   Pulse 80   Temp 98.1 F (36.7 C)   Wt 277 lb 3.2 oz (125.7 kg)   SpO2 97%   BMI 37.08 kg/m    Subjective:    Patient ID: Malik Blake, male    DOB: [birth year], 53 y.o.   MRN: [id number]  HPI: Malik Blake is a 53 y.o. male presenting on 11/06/2018 for Hyperlipidemia   HPI  Pt had negative screeningquestionnairefor CV19    Pt thinks he had been out of his simvastatin when he got his labs drawn.  He says he is feeling well today and has no complaints.      Relevant past medical, surgical, family and social history reviewed and updated as indicated. Interim medical history since our last visit reviewed. Allergies and medications reviewed and updated.   Current Outpatient Medications:  .  ibuprofen (ADVIL) 200 MG tablet, Take 400 mg by mouth every 6 (six) hours as needed., Disp: , Rfl:  .  Lurasidone HCl (LATUDA) 60 MG TABS, Take 1 tablet by mouth daily., Disp: , Rfl:  .  Omega-3 Fatty Acids (FISH OIL PO), Take 2 tablets by mouth at bedtime. , Disp: , Rfl:  .  simvastatin (ZOCOR) 20 MG tablet, Take 1 tablet (20 mg total) by mouth at bedtime., Disp: 30 tablet, Rfl: 7    Review of Systems  Per HPI unless specifically indicated above     Objective:    BP 112/78   Pulse 80   Temp 98.1 F (36.7 C)   Wt 277 lb 3.2 oz (125.7 kg)   SpO2 97%   BMI 37.08 kg/m   Wt Readings from Last 3 Encounters:  11/06/18 277 lb 3.2 oz (125.7 kg)  06/27/18 275 lb (124.7 kg)  05/31/18 281 lb 8 oz (127.7 kg)    Physical Exam Vitals signs reviewed.  Constitutional:      Appearance: He is well-developed.  HENT:     Head: Normocephalic and atraumatic.  Neck:     Musculoskeletal: Neck supple.  Cardiovascular:     Rate and Rhythm: Normal rate and regular rhythm.  Pulmonary:     Effort: Pulmonary effort is normal.     Breath sounds: Normal breath sounds. No wheezing.  Abdominal:     General: Bowel sounds are normal.     Palpations: Abdomen is soft.   Tenderness: There is no abdominal tenderness.  Musculoskeletal:     Right lower leg: No edema.     Left lower leg: No edema.  Lymphadenopathy:     Cervical: No cervical adenopathy.  Skin:    General: Skin is warm and dry.  Neurological:     Mental Status: He is alert and oriented to person, place, and time.  Psychiatric:        Attention and Perception: Attention normal.        Speech: Speech normal.        Behavior: Behavior normal. Behavior is cooperative.     Results for orders placed or performed during the hospital encounter of 10/30/18  Comprehensive metabolic panel  Result Value Ref Range   Sodium 142 135 - 145 mmol/L   Potassium 4.2 3.5 - 5.1 mmol/L   Chloride 107 98 - 111 mmol/L   CO2 26 22 - 32 mmol/L   Glucose, Bld 103 (H) 70 - 99 mg/dL   BUN 12 6 - 20 mg/dL   Creatinine, Ser 4.091.00 0.61 - 1.24 mg/dL   Calcium 9.5 8.9 - 10.3  mg/dL   Total Protein 7.2 6.5 - 8.1 g/dL   Albumin 4.3 3.5 - 5.0 g/dL   AST 32 15 - 41 U/L   ALT 51 (H) 0 - 44 U/L   Alkaline Phosphatase 52 38 - 126 U/L   Total Bilirubin 0.6 0.3 - 1.2 mg/dL   GFR calc non Af Amer >60 >60 mL/min   GFR calc Af Amer >60 >60 mL/min   Anion gap 9 5 - 15  Lipid panel  Result Value Ref Range   Cholesterol 195 0 - 200 mg/dL   Triglycerides 149 <150 mg/dL   HDL 34 (L) >40 mg/dL   Total CHOL/HDL Ratio 5.7 RATIO   VLDL 30 0 - 40 mg/dL   LDL Cholesterol 131 (H) 0 - 99 mg/dL      Assessment & Plan:    Encounter Diagnoses  Name Primary?  . Hyperlipidemia, unspecified hyperlipidemia type Yes  . Screening for colon cancer   . Screening for prostate cancer      -Reviewed labs with pt -counseled pt to Get back on statin and follow lowfat diet -encouraged regular exercise -pt was given ifobt for colon cancer screening -pt was encouraged to wear face mask in public per recommendations of CDC -pt to follow up 6 months.  He is to contact office sooner prn

## 2018-11-12 LAB — IFOBT (OCCULT BLOOD): IFOBT: NEGATIVE

## 2018-11-26 ENCOUNTER — Encounter: Payer: Self-pay | Admitting: Student

## 2019-05-03 ENCOUNTER — Other Ambulatory Visit (HOSPITAL_COMMUNITY)
Admission: RE | Admit: 2019-05-03 | Discharge: 2019-05-03 | Disposition: A | Discharge status: Home / Self Care | Payer: Self-pay | Source: Ambulatory Visit | Attending: Physician Assistant | Admitting: Physician Assistant

## 2019-05-03 ENCOUNTER — Other Ambulatory Visit: Payer: Self-pay

## 2019-05-03 DIAGNOSIS — Z125 Encounter for screening for malignant neoplasm of prostate: Secondary | ICD-10-CM

## 2019-05-03 DIAGNOSIS — E785 Hyperlipidemia, unspecified: Secondary | ICD-10-CM

## 2019-05-03 LAB — COMPREHENSIVE METABOLIC PANEL
ALT: 44 U/L (ref 0–44)
AST: 29 U/L (ref 15–41)
Albumin: 4.4 g/dL (ref 3.5–5.0)
Alkaline Phosphatase: 43 U/L (ref 38–126)
Anion gap: 10 (ref 5–15)
BUN: 11 mg/dL (ref 6–20)
CO2: 23 mmol/L (ref 22–32)
Calcium: 9 mg/dL (ref 8.9–10.3)
Chloride: 107 mmol/L (ref 98–111)
Creatinine, Ser: 0.9 mg/dL (ref 0.61–1.24)
GFR calc Af Amer: >60 mL/min (ref >60)
GFR calc non Af Amer: >60 mL/min (ref >60)
Glucose, Bld: 108 mg/dL — ABNORMAL HIGH (ref 70–99)
Potassium: 4 mmol/L (ref 3.5–5.1)
Sodium: 140 mmol/L (ref 135–145)
Total Bilirubin: 0.7 mg/dL (ref 0.3–1.2)
Total Protein: 7.1 g/dL (ref 6.5–8.1)

## 2019-05-03 LAB — LIPID PANEL
Cholesterol: 166 mg/dL (ref 0–200)
HDL: 34 mg/dL — ABNORMAL LOW (ref >40)
LDL Cholesterol: 102 mg/dL — ABNORMAL HIGH (ref 0–99)
Total CHOL/HDL Ratio: 4.9 RATIO
Triglycerides: 151 mg/dL — ABNORMAL HIGH (ref <150)
VLDL: 30 mg/dL (ref 0–40)

## 2019-05-03 LAB — PSA: Prostatic Specific Antigen: 2.18 ng/mL (ref 0.00–4.00)

## 2019-05-08 ENCOUNTER — Ambulatory Visit: Payer: Self-pay | Admitting: Physician Assistant

## 2019-05-08 ENCOUNTER — Encounter: Payer: Self-pay | Admitting: Physician Assistant

## 2019-05-08 DIAGNOSIS — E785 Hyperlipidemia, unspecified: Secondary | ICD-10-CM

## 2019-05-08 DIAGNOSIS — F39 Unspecified mood [affective] disorder: Secondary | ICD-10-CM

## 2019-05-08 NOTE — Progress Notes (Signed)
There were no vitals taken for this visit.   Subjective:    Patient ID: Malik Blake, male    DOB: [birth year], 53 y.o.   MRN: [id number]  HPI: Malik Blake is a 53 y.o. male presenting on 05/08/2019 for No chief complaint on file.   HPI  This is a telemedicine appointment due to coronavirus pandemic.  It is via telephone as patient does not have a video enabled device.  I connected with  Malik Blake on 05/08/19 by a video enabled telemedicine application and verified that I am speaking with the correct person using two identifiers.   I discussed the limitations of evaluation and management by telemedicine. The patient expressed understanding and agreed to proceed.  Patient is at home.  Provider is at office.    Patient says he is doing okay except for his mental health medications.  Patient sees Dr. Hoyle Barr at [facility] for this.  Patient says for some reason his Anette Guarneri was discontinued and he was put on Zyprexa.  Patient says that he took it for 2 days and he was so exhausted he was sleeping 14 hours so he stopped the medication.   Patient says other than that he is doing quite well.  He has been exercising a lot lately and working on becoming more healthy.    Relevant past medical, surgical, family and social history reviewed and updated as indicated. Interim medical history since our last visit reviewed. Allergies and medications reviewed and updated.    Current Outpatient Medications:  .  ibuprofen (ADVIL) 200 MG tablet, Take 400 mg by mouth every 6 (six) hours as needed., Disp: , Rfl:  .  Omega-3 Fatty Acids (FISH OIL PO), Take 2 tablets by mouth at bedtime. , Disp: , Rfl:  .  simvastatin (ZOCOR) 20 MG tablet, Take 1 tablet (20 mg total) by mouth at bedtime., Disp: 30 tablet, Rfl: 7 .  Lurasidone HCl (LATUDA) 60 MG TABS, Take 1 tablet by mouth daily., Disp: , Rfl:  .  OLANZapine (ZYPREXA) 5 MG tablet, Take 5 mg by mouth at bedtime., Disp: , Rfl:      Review of  Systems  Per HPI unless specifically indicated above     Objective:    There were no vitals taken for this visit.  Wt Readings from Last 3 Encounters:  11/06/18 277 lb 3.2 oz (125.7 kg)  06/27/18 275 lb (124.7 kg)  05/31/18 281 lb 8 oz (127.7 kg)    Physical Exam Pulmonary:     Effort: No respiratory distress.  Neurological:     Mental Status: He is alert and oriented to person, place, and time.  Psychiatric:        Attention and Perception: Attention normal.        Mood and Affect: Mood normal.        Speech: Speech normal.        Behavior: Behavior is cooperative.     Results for orders placed or performed during the hospital encounter of 05/03/19  Lipid panel  Result Value Ref Range   Cholesterol 166 0 - 200 mg/dL   Triglycerides 151 (H) <150 mg/dL   HDL 34 (L) >40 mg/dL   Total CHOL/HDL Ratio 4.9 RATIO   VLDL 30 0 - 40 mg/dL   LDL Cholesterol 102 (H) 0 - 99 mg/dL  Comprehensive metabolic panel  Result Value Ref Range   Sodium 140 135 - 145 mmol/L   Potassium 4.0 3.5 - 5.1 mmol/L  Chloride 107 98 - 111 mmol/L   CO2 23 22 - 32 mmol/L   Glucose, Bld 108 (H) 70 - 99 mg/dL   BUN 11 6 - 20 mg/dL   Creatinine, Ser 4.27 0.61 - 1.24 mg/dL   Calcium 9.0 8.9 - 06.2 mg/dL   Total Protein 7.1 6.5 - 8.1 g/dL   Albumin 4.4 3.5 - 5.0 g/dL   AST 29 15 - 41 U/L   ALT 44 0 - 44 U/L   Alkaline Phosphatase 43 38 - 126 U/L   Total Bilirubin 0.7 0.3 - 1.2 mg/dL   GFR calc non Af Amer >60 >60 mL/min   GFR calc Af Amer >60 >60 mL/min   Anion gap 10 5 - 15  PSA  Result Value Ref Range   Prostatic Specific Antigen 2.18 0.00 - 4.00 ng/mL      Assessment & Plan:    1.  Dyslipidemia Reviewed labs with patient.  Patient is to continue current simvastatin and fish oil.    Patient is encouraged to continue healthy lifestyle with regular exercise and healthy diet.  Patient to follow-up in 6 months.  He is to contact office sooner if needed.  2.  Mood disorder  Patient to  continue with [facility] for treatment.  Encouraged patient to contact [facility] to get seen for follow-up sooner than his 29-month follow-up.

## 2019-05-13 ENCOUNTER — Emergency Department (HOSPITAL_COMMUNITY): Payer: Self-pay

## 2019-05-13 ENCOUNTER — Ambulatory Visit: Payer: Self-pay | Admitting: *Deleted

## 2019-05-13 ENCOUNTER — Encounter (HOSPITAL_COMMUNITY): Payer: Self-pay | Admitting: Emergency Medicine

## 2019-05-13 ENCOUNTER — Other Ambulatory Visit: Payer: Self-pay

## 2019-05-13 ENCOUNTER — Emergency Department (HOSPITAL_COMMUNITY)
Admission: EM | Admit: 2019-05-13 | Discharge: 2019-05-13 | Disposition: A | Discharge status: Home / Self Care | Payer: Self-pay | Attending: Emergency Medicine | Admitting: Emergency Medicine

## 2019-05-13 DIAGNOSIS — M5412 Radiculopathy, cervical region: Secondary | ICD-10-CM | POA: Insufficient documentation

## 2019-05-13 DIAGNOSIS — Z79899 Other long term (current) drug therapy: Secondary | ICD-10-CM | POA: Insufficient documentation

## 2019-05-13 HISTORY — DX: Paranoid schizophrenia: F20.0

## 2019-05-13 LAB — CBC WITH DIFFERENTIAL/PLATELET
Abs Immature Granulocytes: 0.01 10*3/uL (ref 0.00–0.07)
Basophils Absolute: 0 10*3/uL (ref 0.0–0.1)
Basophils Relative: 0 %
Eosinophils Absolute: 0.1 10*3/uL (ref 0.0–0.5)
Eosinophils Relative: 1 %
HCT: 43 % (ref 39.0–52.0)
Hemoglobin: 14.2 g/dL (ref 13.0–17.0)
Immature Granulocytes: 0 %
Lymphocytes Relative: 51 %
Lymphs Abs: 3.5 10*3/uL (ref 0.7–4.0)
MCH: 30.7 pg (ref 26.0–34.0)
MCHC: 33 g/dL (ref 30.0–36.0)
MCV: 92.9 fL (ref 80.0–100.0)
Monocytes Absolute: 0.5 10*3/uL (ref 0.1–1.0)
Monocytes Relative: 7 %
Neutro Abs: 2.8 10*3/uL (ref 1.7–7.7)
Neutrophils Relative %: 41 %
Platelets: 183 10*3/uL (ref 150–400)
RBC: 4.63 MIL/uL (ref 4.22–5.81)
RDW: 13 % (ref 11.5–15.5)
WBC: 6.9 10*3/uL (ref 4.0–10.5)
nRBC: 0 % (ref 0.0–0.2)

## 2019-05-13 LAB — BASIC METABOLIC PANEL
Anion gap: 7 (ref 5–15)
BUN: 10 mg/dL (ref 6–20)
CO2: 23 mmol/L (ref 22–32)
Calcium: 9.1 mg/dL (ref 8.9–10.3)
Chloride: 106 mmol/L (ref 98–111)
Creatinine, Ser: 1.25 mg/dL — ABNORMAL HIGH (ref 0.61–1.24)
GFR calc Af Amer: >60 mL/min (ref >60)
GFR calc non Af Amer: >60 mL/min (ref >60)
Glucose, Bld: 93 mg/dL (ref 70–99)
Potassium: 4.6 mmol/L (ref 3.5–5.1)
Sodium: 136 mmol/L (ref 135–145)

## 2019-05-13 MED ORDER — PREDNISONE 20 MG PO TABS
20.0000 mg | ORAL_TABLET | Freq: Once | ORAL | Status: AC
  Administered 2019-05-13: 20 mg via ORAL
  Filled 2019-05-13: qty 1

## 2019-05-13 MED ORDER — PREDNISONE 20 MG PO TABS: 20.0000 mg | ORAL_TABLET | Freq: Two times a day (BID) | ORAL | 0 refills | Status: DC

## 2019-05-13 MED ORDER — IOHEXOL 350 MG/ML SOLN
100.0000 mL | Freq: Once | INTRAVENOUS | Status: AC | PRN
  Administered 2019-05-13: 21:00:00 75 mL via INTRAVENOUS

## 2019-05-13 NOTE — Discharge Instructions (Addendum)
Your CT scans tonight are negative for any vascular problems.  Your symptoms may be associated with a "pinched nerve" in your neck area which can cause radiation of pain or numbness into either arm or forearm.  Take the medication prescribed which should help with this condition.  You may also try application of ice to your neck and shoulder area to help with any deep tissue inflammation caused by your recent increased activity.  Plan to see your doctor for recheck if your symptoms persist.

## 2019-05-13 NOTE — Telephone Encounter (Signed)
Pt client of [facility]. CAll transferred to clinic [facility]  Answer Assessment - Initial Assessment Questions 1. ONSET: "When did the pain begin?"      Yesterday 2. LOCATION: "Where does it hurt?"      Left side, towards front 3. PATTERN "Does the pain come and go, or has it been constant since it started?"      Intermittent 4. SEVERITY: "How bad is the pain?"  (Scale 1-10; or mild, moderate, severe)   - MILD (1-3): doesn't interfere with normal activities    - MODERATE (4-7): interferes with normal activities or awakens from sleep    - SEVERE (8-10):  excruciating pain, unable to do any normal activities     "Shooting" 5. RADIATION: "Does the pain go anywhere else, shoot into your arms?"     No 6. CORD SYMPTOMS: "Any weakness or numbness of the arms or legs?"     *No Answer* 7. CAUSE: "What do you think is causing the neck pain?"     *No Answer* 8. 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 OTHER SYMPTOMS: "Do you have any other symptoms?" (e.g., headache, fever, chest pain, difficulty breathing, neck swelling)     Lightheadedness turning head  Protocols used: NECK PAIN OR STIFFNESS-A-AH

## 2019-05-13 NOTE — ED Provider Notes (Signed)
[facility] EMERGENCY DEPARTMENT Provider Note   CSN: [id number] Arrival date & time: 05/13/19  1758     History Chief Complaint  Patient presents with   Neck Pain    Malik Blake is a 53 y.o. male with a history of hypercholesterolemia, paranoid schizophrenia and chronic low back pain presenting for evaluation of intermittent sharp sensation at his left lateral neck with episodic numbness in his left forearm since yesterday afternoon. These symptoms are not consistently present simultaneously.  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  He denies specific injuries to his head or neck recently, but describes a significant axial loading type injury when a building truss fell on his head years ago.  He is right handed.  Denies chest pain, sob, has had no n/v, peripheral edema.  He cannot reproduce the pain with movement or palpation.  He has had no treatment prior to arrival.    HPI     Past Medical History:  Diagnosis Date   High cholesterol    Paranoid schizophrenia [facility](HCC)     Patient Active Problem List   Diagnosis Date Noted   Obesity, unspecified 07/08/2015   Chronic back pain 07/08/2015   Hyperlipidemia 04/07/2015    Past Surgical History:  Procedure Laterality Date   TONSILLECTOMY     TONSILLECTOMY         Family History  Problem Relation Age of Onset   Cancer Mother        breast   Cancer Father        prostate    Social History   Tobacco Use   Smoking status: Never Smoker   Smokeless tobacco: Never Used  Substance Use Topics   Alcohol use: No   Drug use: No    Home Medications Prior to Admission medications   Medication Sig Start Date End Date Taking? Authorizing Provider  ibuprofen (ADVIL) 200 MG tablet Take 400 mg by mouth every 6 (six) hours as needed.    [provider]  Lurasidone HCl (LATUDA) 60 MG TABS Take 1 tablet by mouth  daily.    [provider]  OLANZapine (ZYPREXA) 5 MG tablet Take 5 mg by mouth at bedtime.    [provider]  Omega-3 Fatty Acids (FISH OIL PO) Take 2 tablets by mouth at bedtime.     [provider]  predniSONE (DELTASONE) 20 MG tablet Take 1 tablet (20 mg total) by mouth 2 (two) times daily with a meal. 05/13/19   Shevonne Wolf, Raynelle FanningJulie, PA-C  simvastatin (ZOCOR) 20 MG tablet Take 1 tablet (20 mg total) by mouth at bedtime. 11/06/18   Jacquelin HawkingMcElroy, Shannon, PA-C    Allergies    Patient has no known allergies.  Review of Systems   Review of Systems  Constitutional: Negative for chills and fever.  HENT: Negative for congestion and sore throat.   Eyes: Negative.  Negative for visual disturbance.  Respiratory: Negative for chest tightness and shortness of breath.   Cardiovascular: Negative for chest pain, palpitations and leg swelling.  Gastrointestinal: Negative for abdominal pain, nausea and vomiting.  Genitourinary: Negative.   Musculoskeletal: Positive for neck pain. Negative for arthralgias and joint swelling.  Skin: Negative.  Negative for rash and wound.  Neurological: Positive for numbness. Negative for dizziness, facial asymmetry, speech difficulty, weakness, light-headedness and headaches.  Psychiatric/Behavioral: Negative.     Physical Exam Updated Vital Signs BP 116/74  Pulse 77    Temp 97.8 F (36.6 C) (Oral)    Resp 17    Ht 6\' 1"  (1.854 m)    Wt 122.5 kg    SpO2 96%    BMI 35.62 kg/m   Physical Exam Vitals and nursing note reviewed.  Constitutional:      Appearance: He is well-developed.  HENT:     Head: Normocephalic and atraumatic.  Eyes:     Conjunctiva/sclera: Conjunctivae normal.  Neck:     Vascular: No carotid bruit.      Comments: ttp left lateral neck soft tissue, no edema, no mass, no crepitus. No bruit appreciated. Cardiovascular:     Rate and Rhythm: Normal rate and regular rhythm.     Pulses:          Radial pulses are 2+ on the right  side and 2+ on the left side.     Heart sounds: Normal heart sounds.  Pulmonary:     Effort: Pulmonary effort is normal.     Breath sounds: Normal breath sounds. No wheezing.  Abdominal:     General: Bowel sounds are normal.     Palpations: Abdomen is soft.     Tenderness: There is no abdominal tenderness.  Musculoskeletal:        General: Normal range of motion.     Cervical back: Normal range of motion and neck supple. Tenderness present. No rigidity or torticollis. No spinous process tenderness or muscular tenderness.  Skin:    General: Skin is warm and dry.  Neurological:     Mental Status: He is alert and oriented to person, place, and time.     Cranial Nerves: Cranial nerves are intact.     Motor: Motor function is intact. No weakness.     Coordination: Coordination is intact. Coordination normal. Finger-Nose-Finger Test normal. Rapid alternating movements normal.     Deep Tendon Reflexes:     Reflex Scores:      Bicep reflexes are 2+ on the right side and 2+ on the left side.    Comments: Equal grip strength.     ED Results / Procedures / Treatments   Labs (all labs ordered are listed, but only abnormal results are displayed) Labs Reviewed  BASIC METABOLIC PANEL - Abnormal; Notable for the following components:      Result Value   Creatinine, Ser 1.25 (*)    All other components within normal limits  CBC WITH DIFFERENTIAL/PLATELET    EKG None  Radiology CT Angio Head W or Wo Contrast  Result Date: 05/13/2019 CLINICAL DATA:  Dizziness.  Throbbing left neck EXAM: CT ANGIOGRAPHY HEAD AND NECK TECHNIQUE: Multidetector CT imaging of the head and neck was performed using the standard protocol during bolus administration of intravenous contrast. Multiplanar CT image reconstructions and MIPs were obtained to evaluate the vascular anatomy. Carotid stenosis measurements (when applicable) are obtained utilizing NASCET criteria, using the distal internal carotid diameter as the  denominator. CONTRAST:  45mL OMNIPAQUE IOHEXOL 350 MG/ML SOLN COMPARISON:  CT head 08/04/2004 FINDINGS: CT HEAD FINDINGS Brain: No evidence of acute infarction, hemorrhage, hydrocephalus, extra-axial collection or mass lesion/mass effect. Vascular: Negative for hyperdense vessel Skull: Negative Sinuses: Negative Orbits: Negative Review of the MIP images confirms the above findings CTA NECK FINDINGS Aortic arch: Standard branching. Imaged portion shows no evidence of aneurysm or dissection. No significant stenosis of the major arch vessel origins. Right carotid system: Normal right carotid. Negative for atherosclerotic disease or stenosis Left carotid system: Normal  left carotid. Negative for atherosclerotic disease or stenosis. Vertebral arteries: Both vertebral arteries widely patent and normal appearing. Skeleton: Negative Other neck: Negative Upper chest: Negative Review of the MIP images confirms the above findings CTA HEAD FINDINGS Anterior circulation: Cavernous carotid normal bilaterally. Anterior and middle cerebral arteries normal bilaterally without stenosis or large vessel occlusion. No vascular malformation. Posterior circulation: Both vertebral arteries patent to the basilar. PICA patent bilaterally. Basilar widely patent. AICA, superior cerebellar, posterior cerebral arteries patent bilaterally without stenosis. Venous sinuses: Normal enhancement Anatomic variants: None Review of the MIP images confirms the above findings IMPRESSION: Negative CT head Negative CTA head and neck. Electronically Signed   By: Marlan Palau M.D.   On: 05/13/2019 21:15   CT Angio Neck W and/or Wo Contrast  Result Date: 05/13/2019 CLINICAL DATA:  Dizziness.  Throbbing left neck EXAM: CT ANGIOGRAPHY HEAD AND NECK TECHNIQUE: Multidetector CT imaging of the head and neck was performed using the standard protocol during bolus administration of intravenous contrast. Multiplanar CT image reconstructions and MIPs were obtained  to evaluate the vascular anatomy. Carotid stenosis measurements (when applicable) are obtained utilizing NASCET criteria, using the distal internal carotid diameter as the denominator. CONTRAST:  67mL OMNIPAQUE IOHEXOL 350 MG/ML SOLN COMPARISON:  CT head 08/04/2004 FINDINGS: CT HEAD FINDINGS Brain: No evidence of acute infarction, hemorrhage, hydrocephalus, extra-axial collection or mass lesion/mass effect. Vascular: Negative for hyperdense vessel Skull: Negative Sinuses: Negative Orbits: Negative Review of the MIP images confirms the above findings CTA NECK FINDINGS Aortic arch: Standard branching. Imaged portion shows no evidence of aneurysm or dissection. No significant stenosis of the major arch vessel origins. Right carotid system: Normal right carotid. Negative for atherosclerotic disease or stenosis Left carotid system: Normal left carotid. Negative for atherosclerotic disease or stenosis. Vertebral arteries: Both vertebral arteries widely patent and normal appearing. Skeleton: Negative Other neck: Negative Upper chest: Negative Review of the MIP images confirms the above findings CTA HEAD FINDINGS Anterior circulation: Cavernous carotid normal bilaterally. Anterior and middle cerebral arteries normal bilaterally without stenosis or large vessel occlusion. No vascular malformation. Posterior circulation: Both vertebral arteries patent to the basilar. PICA patent bilaterally. Basilar widely patent. AICA, superior cerebellar, posterior cerebral arteries patent bilaterally without stenosis. Venous sinuses: Normal enhancement Anatomic variants: None Review of the MIP images confirms the above findings IMPRESSION: Negative CT head Negative CTA head and neck. Electronically Signed   By: Marlan Palau M.D.   On: 05/13/2019 21:15    Procedures Procedures (including critical care time)  Medications Ordered in ED Medications  predniSONE (DELTASONE) tablet 20 mg (has no administration in time range)  iohexol  (OMNIPAQUE) 350 MG/ML injection 100 mL (75 mLs Intravenous Contrast Given 05/13/19 2047)    ED Course  I have reviewed the triage vital signs and the nursing notes.  Pertinent labs & imaging results that were available during my care of the patient were reviewed by me and considered in my medical decision making (see chart for details).    MDM Rules/Calculators/A&P                      Labs and imaging reviewed and discussed with pt.  No dissection, aneurysm or vascular abnormalities found.  Normal neuro exam.  Suspect sx from musculoskeletal strain from recent increased activity/overuse.  Intermittent forearm numbness probably cervical radiculopathy. Prednisone started, discussed role of ice/heat tx.  Plan f/u with pcp if sx persist or worsen.     Final Clinical Impression(s) / ED  Diagnoses Final diagnoses:  Cervical radiculopathy    Rx / DC Orders ED Discharge Orders         Ordered    predniSONE (DELTASONE) 20 MG tablet  2 times daily with meals     05/13/19 2218           Burgess Amor, PA-C 05/13/19 2311    Geoffery Lyons, MD 05/14/19 1512

## 2019-05-13 NOTE — ED Triage Notes (Signed)
Pt reports throbbing in the left side of his neck with some dizziness starting yesterday afternoon. Does not have symptoms currently.

## 2019-05-14 ENCOUNTER — Telehealth: Payer: Self-pay | Admitting: Student

## 2019-05-14 NOTE — Telephone Encounter (Signed)
Pt called 05-13-19 c/o "throbbing sharp pain on L side of neck". Pt reports this started 05-12-19 in the evening around 6 pm occurring about 10 times lasting about a second. Pt states this happened 3-4 times 05-13-19 before he called. Pt reports no pain when turning his head. Pt states he was bent over tearing up floor on 12/8, 12/9, and 12/10 and was sore afterwards. Pt states unsure if this is related. LPN informed pt that she will discuss with PA and call pt back. Pt verbalized understanding.  PA recommends IBU, to apply moist heat, and avoid heavy lifting. LPN to confirm with pt where exactly on his neck is the pain and if no other symptoms such as dizziness.  LPN called pt back on 05-14-19 and was unable to leave vm as vm is not set up. Will attempt again at a later time.

## 2019-10-17 ENCOUNTER — Other Ambulatory Visit (HOSPITAL_COMMUNITY)
Admission: RE | Admit: 2019-10-17 | Discharge: 2019-10-17 | Disposition: A | Discharge status: Home / Self Care | Payer: Self-pay | Source: Ambulatory Visit | Attending: Physician Assistant | Admitting: Physician Assistant

## 2019-10-17 DIAGNOSIS — E785 Hyperlipidemia, unspecified: Secondary | ICD-10-CM | POA: Insufficient documentation

## 2019-10-17 LAB — COMPREHENSIVE METABOLIC PANEL
ALT: 43 U/L (ref 0–44)
AST: 29 U/L (ref 15–41)
Albumin: 4.3 g/dL (ref 3.5–5.0)
Alkaline Phosphatase: 46 U/L (ref 38–126)
Anion gap: 11 (ref 5–15)
BUN: 15 mg/dL (ref 6–20)
CO2: 25 mmol/L (ref 22–32)
Calcium: 9.1 mg/dL (ref 8.9–10.3)
Chloride: 103 mmol/L (ref 98–111)
Creatinine, Ser: 0.89 mg/dL (ref 0.61–1.24)
GFR calc Af Amer: >60 mL/min (ref >60)
GFR calc non Af Amer: >60 mL/min (ref >60)
Glucose, Bld: 102 mg/dL — ABNORMAL HIGH (ref 70–99)
Potassium: 3.9 mmol/L (ref 3.5–5.1)
Sodium: 139 mmol/L (ref 135–145)
Total Bilirubin: 0.7 mg/dL (ref 0.3–1.2)
Total Protein: 6.8 g/dL (ref 6.5–8.1)

## 2019-10-17 LAB — LIPID PANEL
Cholesterol: 209 mg/dL — ABNORMAL HIGH (ref 0–200)
HDL: 36 mg/dL — ABNORMAL LOW (ref >40)
LDL Cholesterol: 132 mg/dL — ABNORMAL HIGH (ref 0–99)
Total CHOL/HDL Ratio: 5.8 RATIO
Triglycerides: 204 mg/dL — ABNORMAL HIGH (ref <150)
VLDL: 41 mg/dL — ABNORMAL HIGH (ref 0–40)

## 2019-10-21 ENCOUNTER — Ambulatory Visit: Payer: Self-pay | Admitting: Physician Assistant

## 2019-10-21 ENCOUNTER — Encounter: Payer: Self-pay | Admitting: Physician Assistant

## 2019-10-21 ENCOUNTER — Other Ambulatory Visit: Payer: Self-pay

## 2019-10-21 VITALS — BP 118/78 | HR 77 | Temp 98.8°F | Wt 276.6 lb

## 2019-10-21 DIAGNOSIS — F39 Unspecified mood [affective] disorder: Secondary | ICD-10-CM

## 2019-10-21 DIAGNOSIS — G8929 Other chronic pain: Secondary | ICD-10-CM

## 2019-10-21 DIAGNOSIS — E785 Hyperlipidemia, unspecified: Secondary | ICD-10-CM

## 2019-10-21 DIAGNOSIS — E669 Obesity, unspecified: Secondary | ICD-10-CM

## 2019-10-21 MED ORDER — SIMVASTATIN 20 MG PO TABS: 20.0000 mg | ORAL_TABLET | Freq: Every day | ORAL | 7 refills | Status: AC

## 2019-10-21 NOTE — Progress Notes (Signed)
BP 118/78   Pulse 77   Temp 98.8 F (37.1 C)   Wt 276 lb 9.6 oz (125.5 kg)   SpO2 97%   BMI 36.49 kg/m    Subjective:    Patient ID: Malik Blake, male    DOB: 10/13/1965, 54 y.o.   MRN: [id number]  HPI: Malik Blake is a 54 y.o. male presenting on 10/21/2019 for Hyperlipidemia   HPI Pt had a negative covid 19 screening questionnaire.    Pt not taking any meds for a while.  He says he spoke with his psychiatrist about it.   He says he doesn't really feel depressed, mostly irritable and pissed off.   No one in particular, just in general.    Pt is not going to get vaccinated.    He is thinking he is in process of cafa but isn't sure.       Relevant past medical, surgical, family and social history reviewed and updated as indicated. Interim medical history since our last visit reviewed. Allergies and medications reviewed and updated.   Current Outpatient Medications:  .  ibuprofen (ADVIL) 200 MG tablet, Take 400 mg by mouth every 6 (six) hours as needed., Disp: , Rfl:  .  Omega-3 Fatty Acids (FISH OIL PO), Take 2 tablets by mouth at bedtime. , Disp: , Rfl:  .  Lurasidone HCl (LATUDA) 60 MG TABS, Take 1 tablet by mouth daily., Disp: , Rfl:  .  OLANZapine (ZYPREXA) 5 MG tablet, Take 5 mg by mouth at bedtime., Disp: , Rfl:  .  simvastatin (ZOCOR) 20 MG tablet, Take 1 tablet (20 mg total) by mouth at bedtime. (Patient not taking: Reported on 10/21/2019), Disp: 30 tablet, Rfl: 7     Review of Systems  Per HPI unless specifically indicated above     Objective:    BP 118/78   Pulse 77   Temp 98.8 F (37.1 C)   Wt 276 lb 9.6 oz (125.5 kg)   SpO2 97%   BMI 36.49 kg/m   Wt Readings from Last 3 Encounters:  10/21/19 276 lb 9.6 oz (125.5 kg)  05/13/19 270 lb (122.5 kg)  11/06/18 277 lb 3.2 oz (125.7 kg)    Physical Exam Vitals reviewed.  Constitutional:      Appearance: He is well-developed.  HENT:     Head: Normocephalic and atraumatic.  Cardiovascular:      Rate and Rhythm: Normal rate and regular rhythm.  Pulmonary:     Effort: Pulmonary effort is normal.     Breath sounds: Normal breath sounds. No wheezing.  Abdominal:     General: Bowel sounds are normal.     Palpations: Abdomen is soft.     Tenderness: There is no abdominal tenderness.  Musculoskeletal:     Cervical back: Neck supple.     Right lower leg: No edema.     Left lower leg: No edema.  Lymphadenopathy:     Cervical: No cervical adenopathy.  Skin:    General: Skin is warm and dry.  Neurological:     Mental Status: He is alert and oriented to person, place, and time.  Psychiatric:        Attention and Perception: Attention normal.        Speech: Speech normal.        Behavior: Behavior is cooperative.     Results for orders placed or performed during the hospital encounter of 10/17/19  Lipid panel  Result Value Ref Range  Cholesterol 209 (H) 0 - 200 mg/dL   Triglycerides 253 (H) <150 mg/dL   HDL 36 (L) >66 mg/dL   Total CHOL/HDL Ratio 5.8 RATIO   VLDL 41 (H) 0 - 40 mg/dL   LDL Cholesterol 440 (H) 0 - 99 mg/dL  Comprehensive metabolic panel  Result Value Ref Range   Sodium 139 135 - 145 mmol/L   Potassium 3.9 3.5 - 5.1 mmol/L   Chloride 103 98 - 111 mmol/L   CO2 25 22 - 32 mmol/L   Glucose, Bld 102 (H) 70 - 99 mg/dL   BUN 15 6 - 20 mg/dL   Creatinine, Ser 3.47 0.61 - 1.24 mg/dL   Calcium 9.1 8.9 - 42.5 mg/dL   Total Protein 6.8 6.5 - 8.1 g/dL   Albumin 4.3 3.5 - 5.0 g/dL   AST 29 15 - 41 U/L   ALT 43 0 - 44 U/L   Alkaline Phosphatase 46 38 - 126 U/L   Total Bilirubin 0.7 0.3 - 1.2 mg/dL   GFR calc non Af Amer >60 >60 mL/min   GFR calc Af Amer >60 >60 mL/min   Anion gap 11 5 - 15      Assessment & Plan:    Encounter Diagnoses  Name Primary?  . Hyperlipidemia, unspecified hyperlipidemia type Yes  . Mood disorder (HCC)   . Chronic bilateral low back pain without sciatica   . Obesity, unspecified classification, unspecified obesity type,  unspecified whether serious comorbidity present      -reviewed labs with pt  -Pt offered to return to oerthopedics for his back he saw Dr Hardie Lora in past.  Pt says he doesn't know.  So we won't send him and he says he is fine with that -pt counseled to Get back on simvastatin and follow lowfat diet -pt to Continue with [facility] for [facility] health issues follo up 4 months.  He is to contact office sooner prn

## 2020-02-13 ENCOUNTER — Other Ambulatory Visit: Payer: Self-pay

## 2020-02-13 DIAGNOSIS — Z20822 Contact with and (suspected) exposure to covid-19: Secondary | ICD-10-CM

## 2020-02-15 LAB — NOVEL CORONAVIRUS, NAA: SARS-CoV-2, NAA: DETECTED — AB

## 2020-02-15 LAB — SPECIMEN STATUS REPORT

## 2020-02-15 LAB — SARS-COV-2, NAA 2 DAY TAT

## 2020-02-24 ENCOUNTER — Ambulatory Visit: Payer: Self-pay | Admitting: Physician Assistant

## 2020-03-05 ENCOUNTER — Emergency Department (HOSPITAL_COMMUNITY)
Admission: EM | Admit: 2020-03-05 | Discharge: 2020-03-05 | Disposition: A | Discharge status: Home / Self Care | Payer: Self-pay | Attending: Emergency Medicine | Admitting: Emergency Medicine

## 2020-03-05 ENCOUNTER — Encounter (HOSPITAL_COMMUNITY): Payer: Self-pay | Admitting: Emergency Medicine

## 2020-03-05 ENCOUNTER — Other Ambulatory Visit: Payer: Self-pay

## 2020-03-05 DIAGNOSIS — Z5321 Procedure and treatment not carried out due to patient leaving prior to being seen by health care provider: Secondary | ICD-10-CM | POA: Insufficient documentation

## 2020-03-05 DIAGNOSIS — F22 Delusional disorders: Secondary | ICD-10-CM | POA: Insufficient documentation

## 2020-03-05 NOTE — ED Triage Notes (Signed)
Pt states "I feel like someone is trying to poison my food or drink" pt states "I feel like I have stuff running through my veins that shouldn't be there. Both my arms and both of my legs feel like toxins are running through them." pt states this started this morning after he ate breakfast. Pt states hes very paranoid and he think theres someone after him.

## 2020-03-05 NOTE — ED Notes (Signed)
Reports wanting to leave now - feeling better. Denies SI or HI. MD aware.

## 2020-03-10 ENCOUNTER — Encounter (HOSPITAL_COMMUNITY): Payer: Self-pay | Admitting: *Deleted

## 2020-03-10 ENCOUNTER — Other Ambulatory Visit: Payer: Self-pay

## 2020-03-10 ENCOUNTER — Emergency Department (HOSPITAL_COMMUNITY)
Admission: EM | Admit: 2020-03-10 | Discharge: 2020-03-11 | Disposition: A | Discharge status: Home / Self Care | Payer: Self-pay | Attending: Emergency Medicine | Admitting: Emergency Medicine

## 2020-03-10 DIAGNOSIS — Z20822 Contact with and (suspected) exposure to covid-19: Secondary | ICD-10-CM | POA: Insufficient documentation

## 2020-03-10 DIAGNOSIS — Z7689 Persons encountering health services in other specified circumstances: Secondary | ICD-10-CM

## 2020-03-10 DIAGNOSIS — F22 Delusional disorders: Secondary | ICD-10-CM | POA: Insufficient documentation

## 2020-03-10 DIAGNOSIS — Z046 Encounter for general psychiatric examination, requested by authority: Secondary | ICD-10-CM | POA: Insufficient documentation

## 2020-03-10 DIAGNOSIS — F209 Schizophrenia, unspecified: Secondary | ICD-10-CM | POA: Insufficient documentation

## 2020-03-10 LAB — CBC WITH DIFFERENTIAL/PLATELET
Abs Immature Granulocytes: 0.02 10*3/uL (ref 0.00–0.07)
Basophils Absolute: 0 10*3/uL (ref 0.0–0.1)
Basophils Relative: 0 %
Eosinophils Absolute: 0 10*3/uL (ref 0.0–0.5)
Eosinophils Relative: 0 %
HCT: 43.9 % (ref 39.0–52.0)
Hemoglobin: 15.2 g/dL (ref 13.0–17.0)
Immature Granulocytes: 0 %
Lymphocytes Relative: 38 %
Lymphs Abs: 3.1 10*3/uL (ref 0.7–4.0)
MCH: 31.3 pg (ref 26.0–34.0)
MCHC: 34.6 g/dL (ref 30.0–36.0)
MCV: 90.3 fL (ref 80.0–100.0)
Monocytes Absolute: 0.6 10*3/uL (ref 0.1–1.0)
Monocytes Relative: 7 %
Neutro Abs: 4.5 10*3/uL (ref 1.7–7.7)
Neutrophils Relative %: 55 %
Platelets: 175 10*3/uL (ref 150–400)
RBC: 4.86 MIL/uL (ref 4.22–5.81)
RDW: 12.7 % (ref 11.5–15.5)
WBC: 8.4 10*3/uL (ref 4.0–10.5)
nRBC: 0 % (ref 0.0–0.2)

## 2020-03-10 LAB — RAPID URINE DRUG SCREEN, HOSP PERFORMED
Amphetamines: NOT DETECTED
Barbiturates: NOT DETECTED
Benzodiazepines: NOT DETECTED
Cocaine: NOT DETECTED
Opiates: NOT DETECTED
Tetrahydrocannabinol: NOT DETECTED

## 2020-03-10 LAB — BASIC METABOLIC PANEL
Anion gap: 10 (ref 5–15)
BUN: 9 mg/dL (ref 6–20)
CO2: 26 mmol/L (ref 22–32)
Calcium: 9.5 mg/dL (ref 8.9–10.3)
Chloride: 101 mmol/L (ref 98–111)
Creatinine, Ser: 0.94 mg/dL (ref 0.61–1.24)
GFR, Estimated: >60 mL/min (ref >60)
Glucose, Bld: 94 mg/dL (ref 70–99)
Potassium: 3.4 mmol/L — ABNORMAL LOW (ref 3.5–5.1)
Sodium: 137 mmol/L (ref 135–145)

## 2020-03-10 LAB — ETHANOL: Alcohol, Ethyl (B): <10 mg/dL (ref <10)

## 2020-03-10 MED ORDER — SIMVASTATIN 10 MG PO TABS: 20.0000 mg | ORAL_TABLET | Freq: Every day | ORAL | Status: DC

## 2020-03-10 MED ORDER — POTASSIUM CHLORIDE CRYS ER 20 MEQ PO TBCR: 40.0000 meq | EXTENDED_RELEASE_TABLET | Freq: Once | ORAL | Status: DC

## 2020-03-10 NOTE — ED Notes (Addendum)
Pt becoming more agitated at this time. Pt refusing oral medications. Pt demanding to leave. Explained to pt that he is IVC'd and is awaiting for psych to evaluate pt.

## 2020-03-10 NOTE — ED Triage Notes (Signed)
Pt IVC'd and per paperwork pt has paranoid schizophrenia and not taking his medications.  Per pt not taking meds because he told that he didn't have to take it. Pt denies SI or HI, unless he is threatened by others. Pt states "I'm not out to get anyone". Denies any auditory or visual hallucinations.

## 2020-03-10 NOTE — ED Provider Notes (Signed)
[facility] EMERGENCY DEPARTMENT Provider Note   CSN: [id number] Arrival date & time: 03/10/20  1925     History Chief Complaint  Patient presents with  . V70.1    Malik Blake is a 54 y.o. male.  HPI   54 year old male with a history of hyperlipidemia, paranoid schizophrenia, who presents emergency department today for evaluation under IVC.  Patient states that he is in the emergency department today because "the police brought me ".  States that he was talking to his "baby mama too much and "that she took out the paperwork.  He states that he has been feeling more paranoid recently and he has not been taking his medication.  He denies any visual or auditory hallucinations at this time.  Denies any SI, HI.  He states he drinks some beer occasionally but does not use any drugs or drink heavily.  He denies any medical complaints at this time.  Per IVC paperwork, "respondent has a history of mental illness.  Respondent has been diagnosed with paranoid schizophrenia.  He is a patient at [facility].  Respondent is required medication for this but is not taking at this time.  Respondent has contact the mother of his child and told her he is hearing voices telling him to do bad things and she has no answer to his problems or tell him.  He told her she is poisoning him.  Respondent has also stated his mother is poisoning him.  Respondent stated he went to [facility] this past weekend and told him he wanted to take his blood to check for poison and was placed in room 16 but left prior to being seen.  He was stated that people are after him and he is going to get protection to take care of himself.  Petitioner feels her son is a danger to himself and others "  Past Medical History:  Diagnosis Date  . High cholesterol   . Paranoid schizophrenia [facility])     Patient Active Problem List   Diagnosis Date Noted  . Obesity, unspecified 07/08/2015  . Chronic back pain 07/08/2015  .  Hyperlipidemia 04/07/2015    Past Surgical History:  Procedure Laterality Date  . TONSILLECTOMY    . TONSILLECTOMY         Family History  Problem Relation Age of Onset  . Cancer Mother        breast  . Cancer Father        prostate    Social History   Tobacco Use  . Smoking status: Never Smoker  . Smokeless tobacco: Never Used  Substance Use Topics  . Alcohol use: Yes    Comment: occasionally  . Drug use: No    Home Medications Prior to Admission medications   Medication Sig Start Date End Date Taking? Authorizing Provider  ibuprofen (ADVIL) 200 MG tablet Take 400 mg by mouth every 6 (six) hours as needed.    [provider]  Lurasidone HCl (LATUDA) 60 MG TABS Take 1 tablet by mouth daily.    [provider]  OLANZapine (ZYPREXA) 5 MG tablet Take 5 mg by mouth at bedtime.    [provider]  Omega-3 Fatty Acids (FISH OIL PO) Take 2 tablets by mouth at bedtime.     [provider]  simvastatin (ZOCOR) 20 MG tablet Take 1 tablet (20 mg total) by mouth at bedtime. 10/21/19   Jacquelin Hawking, PA-C    Allergies    Patient has  no known allergies.  Review of Systems   Review of Systems  Constitutional: Negative for chills and fever.  HENT: Negative for ear pain and sore throat.   Eyes: Negative for pain and visual disturbance.  Respiratory: Negative for cough and shortness of breath.   Cardiovascular: Negative for chest pain.  Gastrointestinal: Negative for abdominal pain, constipation, diarrhea, nausea and vomiting.  Genitourinary: Negative for dysuria and hematuria.  Musculoskeletal: Negative for back pain.  Skin: Negative for rash.  Neurological: Negative for seizures and syncope.  Psychiatric/Behavioral: Negative for hallucinations and suicidal ideas.       Paranoid  All other systems reviewed and are negative.   Physical Exam Updated Vital Signs BP (!) 142/71 (BP Location: Right Arm)   Pulse 95   Temp 98.2 F (36.8 C)  (Oral)   Resp 14   Ht 6\' 1"  (1.854 m)   Wt 118.8 kg   SpO2 98%   BMI 34.57 kg/m   Physical Exam Vitals and nursing note reviewed.  Constitutional:      Appearance: He is well-developed.  HENT:     Head: Normocephalic and atraumatic.  Eyes:     Conjunctiva/sclera: Conjunctivae normal.  Cardiovascular:     Rate and Rhythm: Normal rate.  Pulmonary:     Effort: Pulmonary effort is normal.  Abdominal:     General: There is no distension.  Musculoskeletal:     Cervical back: Neck supple.  Skin:    General: Skin is warm and dry.  Neurological:     Mental Status: He is alert.  Psychiatric:        Attention and Perception: Attention normal.        Mood and Affect: Mood normal.        Speech: Speech normal.        Behavior: Behavior normal.        Thought Content: Thought content is paranoid. Thought content does not include homicidal or suicidal ideation. Thought content does not include homicidal or suicidal plan.     ED Results / Procedures / Treatments   Labs (all labs ordered are listed, but only abnormal results are displayed) Labs Reviewed  BASIC METABOLIC PANEL - Abnormal; Notable for the following components:      Result Value   Potassium 3.4 (*)    All other components within normal limits  RESP PANEL BY RT PCR (RSV, FLU A&B, COVID)  CBC WITH DIFFERENTIAL/PLATELET  ETHANOL  RAPID URINE DRUG SCREEN, HOSP PERFORMED    EKG None  Radiology No results found.  Procedures Procedures (including critical care time)  Medications Ordered in ED Medications  potassium chloride SA (KLOR-CON) CR tablet 40 mEq (has no administration in time range)  simvastatin (ZOCOR) tablet 20 mg (has no administration in time range)    ED Course  I have reviewed the triage vital signs and the nursing notes.  Pertinent labs & imaging results that were available during my care of the patient were reviewed by me and considered in my medical decision making (see chart for  details).    MDM Rules/Calculators/A&P                          54 year old male presenting the emergency department today under IVC for a psychiatric evaluation.  His child's mother took out IVC paperwork on him because he called her stating that he was hearing voices and experiencing paranoid behaviors.  He is off his medications and she feels  that he is a threat to himself and others.  Reviewed/interpreted labs CBC is unremarkable BMP shows some mild hypokalemia, otherwise reassuring UDS and EtOH are negative  Pt states he has not been taking his medications for some time because they make him  Too sleepy.  He does not appear to have any acute medical concerns at this time that would require further work-up or admission to the hospital.  He is appropriate for TTS evaluation.  The patient has been placed in psychiatric observation due to the need to provide a safe environment for the patient while obtaining psychiatric consultation and evaluation, as well as ongoing medical and medication management to treat the patient's condition.  The patient has not been placed under full IVC at this time.   Final Clinical Impression(s) / ED Diagnoses Final diagnoses:  Encounter for psychiatric assessment    Rx / DC Orders ED Discharge Orders    None       Rayne Du 03/10/20 2253    Mancel Bale, MD 03/11/20 1043

## 2020-03-11 ENCOUNTER — Other Ambulatory Visit: Payer: Self-pay

## 2020-03-11 ENCOUNTER — Inpatient Hospital Stay (HOSPITAL_COMMUNITY)
Admission: AD | Admit: 2020-03-11 | Discharge: 2020-03-16 | DRG: 885 | Disposition: A | Discharge status: Home / Self Care | Payer: Federal, State, Local not specified - Other | Source: Intra-hospital | Attending: Psychiatry | Admitting: Psychiatry

## 2020-03-11 ENCOUNTER — Encounter (HOSPITAL_COMMUNITY): Payer: Self-pay | Admitting: Psychiatry

## 2020-03-11 DIAGNOSIS — M549 Dorsalgia, unspecified: Secondary | ICD-10-CM | POA: Diagnosis present

## 2020-03-11 DIAGNOSIS — Z6833 Body mass index (BMI) 33.0-33.9, adult: Secondary | ICD-10-CM | POA: Diagnosis not present

## 2020-03-11 DIAGNOSIS — E669 Obesity, unspecified: Secondary | ICD-10-CM | POA: Diagnosis present

## 2020-03-11 DIAGNOSIS — F209 Schizophrenia, unspecified: Secondary | ICD-10-CM | POA: Diagnosis present

## 2020-03-11 DIAGNOSIS — F200 Paranoid schizophrenia: Principal | ICD-10-CM | POA: Diagnosis present

## 2020-03-11 DIAGNOSIS — E7800 Pure hypercholesterolemia, unspecified: Secondary | ICD-10-CM | POA: Diagnosis present

## 2020-03-11 DIAGNOSIS — E782 Mixed hyperlipidemia: Secondary | ICD-10-CM

## 2020-03-11 DIAGNOSIS — G8929 Other chronic pain: Secondary | ICD-10-CM | POA: Diagnosis present

## 2020-03-11 LAB — RESP PANEL BY RT PCR (RSV, FLU A&B, COVID)
Influenza A by PCR: NEGATIVE
Influenza B by PCR: NEGATIVE
Respiratory Syncytial Virus by PCR: NEGATIVE
SARS Coronavirus 2 by RT PCR: NEGATIVE

## 2020-03-11 MED ORDER — ZIPRASIDONE MESYLATE 20 MG IM SOLR: 20.0000 mg | Freq: Once | INTRAMUSCULAR | Status: DC

## 2020-03-11 MED ORDER — SIMVASTATIN 20 MG PO TABS
20.0000 mg | ORAL_TABLET | Freq: Every day | ORAL | Status: DC
  Administered 2020-03-14: 20 mg via ORAL
  Filled 2020-03-11: qty 1
  Filled 2020-03-11: qty 7
  Filled 2020-03-11 (×3): qty 1
  Filled 2020-03-11: qty 7
  Filled 2020-03-11 (×3): qty 1

## 2020-03-11 MED ORDER — ZIPRASIDONE MESYLATE 20 MG IM SOLR
INTRAMUSCULAR | Status: AC
  Filled 2020-03-11: qty 20

## 2020-03-11 MED ORDER — ZIPRASIDONE MESYLATE 20 MG IM SOLR
20.0000 mg | Freq: Once | INTRAMUSCULAR | Status: AC
  Administered 2020-03-11: 20 mg via INTRAMUSCULAR

## 2020-03-11 MED ORDER — STERILE WATER FOR INJECTION IJ SOLN
INTRAMUSCULAR | Status: AC
  Filled 2020-03-11: qty 10

## 2020-03-11 NOTE — ED Notes (Signed)
Report received  Pt is resting currently  [facility] close by   Awaiting re-eval and placement

## 2020-03-11 NOTE — ED Notes (Signed)
IVC petitioner would like to speak to TTS. Malik Blake - [phone]

## 2020-03-11 NOTE — ED Notes (Signed)
Called [facility] for transport of Pt to [facility].  To be there at 5PM.

## 2020-03-11 NOTE — BH Assessment (Signed)
Tele Assessment Note   Patient Name: Malik Blake MRN: [id number] Referring Physician: Sharion Doveortni Souture, PA-C [facility] Location of Provider: Behavioral Health TTS Department  Malik FreezeBarry K Blake is a 54 y.o. male who was involuntarily brought to [facility] by the police department under an IVC order. Pt states, "I, as of late, have not been seeing my daughter like I would like. I had a conversation with her caretaker. She asked if I'd been taking my medications and [facility] and made some untrue statements and here I am." Pt acknowledges he is not currently taking his medication, stating he was told by his mental health provider that he only needed to take them if he felt he needed to. He states, "If I remember right, whatever was the latest [provider] changed my medication. The last he told me was to take it if I needed it. It made me sleep 12 hours/night."  Pt denies SI, a hx of SI, any prior attempts to kill himself, or a current plan to kill himself. Pt states he has was [facility] the [facility]. He endorses experiencing paranoia and shares he's been staying with his mother, as he's been skeptical regarding his ability to make decisions (whether to turn left or right when driving, etc). Pt denies HI and adds, "not specifically, no; I'm not looking to hurt anybody; I'm not looking to hurt anybody." Pt denies AVH but again reinforces that "I'm having trouble with decision-making." Pt denies NSSIB and engagement with the legal system. He states he owns a shotgun that he gave to his cousin to keep for him. He states he engages in a couple of 12-ounce beers 1-2 nights a week at the bars.  Per the PA's note: "Per IVC paperwork, "respondent has a history of mental illness.  Respondent has been diagnosed with paranoid schizophrenia.  [facility]  Respondent is required medication for this but is not taking at this time.  Respondent  has contact the mother of his child and told her he is hearing voices telling him to do bad things and she has no answer to his problems or tell him.  He told her she is poisoning him.  Respondent has also stated his mother is poisoning him.  Respondent stated he [facility] to [facility] this past weekend and told him he wanted to take his blood to check for poison and was placed in room 16 but left prior to being seen.  He was stated that people are after him and he is going to get protection to take care of himself.  Petitioner feels her son is a danger to himself and others.""  Pt's protective factors include lack of SI, HI, AVH, and the support of his mother.  Pt reinforced verbal consent for clinician to make contact with the mother of his child; clinician has not received the IVC paperwork in which to have the contact information to reach this person for collateral.  Pt is oriented x5. His recent/remote memory is intact. Pt was guarded with clinician but, overall, answered all questions posed. Pt's insight, judgement, and impulse control is poor at this time.   Disposition: Nira ConnJason Berry, NP, reviewed pt's chart and information and determined pt meets inpatient criteria. [facility]. This information was given to pt's providers at [phone].   Diagnosis: F20.9, Schizophrenia   Past Medical History:  Past Medical History:  Diagnosis Date  . High cholesterol   . Paranoid schizophrenia [facility])     Past Surgical History:  Procedure Laterality Date  . TONSILLECTOMY    . TONSILLECTOMY      Family History:  Family History  Problem Relation Age of Onset  . Cancer Mother        breast  . Cancer Father        prostate    Social History:  reports that he has never smoked. He has never used smokeless tobacco. He reports current alcohol use. He reports that he does not use drugs.  Additional Social History:  Alcohol / Drug Use Pain Medications: Please see  MAR Prescriptions: Please see MAR Over the Counter: Please see MAR History of alcohol / drug use?: Yes Longest period of sobriety (when/how long): Unknown Substance #1 Name of Substance 1: EtOH 1 - Age of First Use: 34 1 - Amount (size/oz): A couple of 12 ounce beers 1 - Frequency: 1-2x/week some weeks 1 - Duration: Unknown 1 - Last Use / Amount: Unsure  CIWA: CIWA-Ar BP: 130/66 Pulse Rate: 66 COWS:    Allergies: No Known Allergies  Home Medications: (Not in a hospital admission)   OB/GYN Status:  No LMP for male patient.  General Assessment Data Location of Assessment: [facility] TTS Assessment: In system Is this a Tele or Face-to-Face Assessment?: Tele Assessment Is this an Initial Assessment or a Re-assessment for this encounter?: Initial Assessment Patient Accompanied by:: N/A Language Other than English: No Living Arrangements: Other (Comment) (Pt lives has been staying w/ his mother; has own home) What gender do you identify as?: Male Date Telepsych consult ordered in [facility]: 03/10/20 Time Telepsych consult ordered in [facility]: [id number] Marital status: Divorced Living Arrangements: Parent (Pt has been staying w/ his mother; also has his own home) Can pt return to current living arrangement?: Yes Admission Status: Involuntary Petitioner: Other (Ex-girlfriend; "baby mama") Is patient capable of signing voluntary admission?: Yes Referral Source: Self/Family/Friend Insurance type: Self-pay     Crisis Care Plan Living Arrangements: Parent (Pt has been staying w/ his mother; also has his own home) Legal Guardian: Other: (Self) Name of Psychiatrist: None Name of Therapist: None  Education Status Is patient currently in school?: No Is the patient employed, unemployed or receiving disability?: Receiving disability income  Risk to self with the past 6 months Suicidal Ideation: No Has patient been a risk to self within the past 6 months prior to admission? : No Suicidal Intent:  No Has patient had any suicidal intent within the past 6 months prior to admission? : No Is patient at risk for suicide?: No Suicidal Plan?: No Has patient had any suicidal plan within the past 6 months prior to admission? : No Access to Means: No What has been your use of drugs/alcohol within the last 12 months?: Pt acknowledges EtOH use Previous Attempts/Gestures: No How many times?: 0 Other Self Harm Risks: Pt has not been taking his medication, has become paranoid Triggers for Past Attempts: None known Intentional Self Injurious Behavior: None Family Suicide History: Unknown Recent stressful life event(s): Conflict (Comment), Loss (Comment) (Conflict between him & daughter's mother, break-up) Persecutory voices/beliefs?: No Depression: Yes Depression Symptoms: Despondent, Isolating, Fatigue, Guilt, Feeling worthless/self pity, Feeling angry/irritable Substance abuse history and/or treatment for substance abuse?: No Suicide prevention information given to non-admitted patients: Not applicable  Risk to Others within the past 6 months Homicidal Ideation: No Does patient have any lifetime risk of violence toward others  beyond the six months prior to admission? : No Thoughts of Harm to Others: No Current Homicidal Intent: No Current Homicidal Plan: No Access to Homicidal Means: No Identified Victim: None noted History of harm to others?: No Assessment of Violence: None Noted Violent Behavior Description: None noted Does patient have access to weapons?: No (Pt denies access to guns/weapons; gave shotgun to cousin) Criminal Charges Pending?: No Does patient have a court date: No Is patient on probation?: No  Psychosis Hallucinations: None noted Delusions: None noted  Mental Status Report Appearance/Hygiene: In scrubs Eye Contact: Good Motor Activity: Unremarkable Speech: Slow Level of Consciousness: Sedated Mood: Anxious, Suspicious, Preoccupied Affect: Appropriate to  circumstance Anxiety Level: Moderate Thought Processes: Coherent Judgement: Partial Orientation: Person, Place, Time, Situation Obsessive Compulsive Thoughts/Behaviors: None  Cognitive Functioning Concentration: Decreased Memory: Recent Intact, Remote Intact Is patient IDD: No Insight: Fair Impulse Control: Poor Appetite: Good Have you had any weight changes? :  (UTA) Sleep: Unable to Assess Total Hours of Sleep:  (UTA) Vegetative Symptoms: None  ADLScreening [facility] Assessment Services) Patient's cognitive ability adequate to safely complete daily activities?: Yes Patient able to express need for assistance with ADLs?: Yes Independently performs ADLs?: Yes (appropriate for developmental age)  Prior Inpatient Therapy Prior Inpatient Therapy: Yes Prior Therapy Dates: 2007 Prior Therapy Facilty/Provider(s): [facility] Reason for Treatment: Pt cannot remember  Prior Outpatient Therapy Prior Outpatient Therapy: Yes Prior Therapy Dates: Unknown Prior Therapy Facilty/Provider(s): [facility] - Dr. Geanie Cooley Reason for Treatment: Bipolar Disorder Does patient have an ACCT team?: No Does patient have Intensive In-House Services?  : No Does patient have [facility] services? : No Does patient have P4CC services?: No  ADL Screening (condition at time of admission) Patient's cognitive ability adequate to safely complete daily activities?: Yes Is the patient deaf or have difficulty hearing?: No Does the patient have difficulty seeing, even when wearing glasses/contacts?: No Does the patient have difficulty concentrating, remembering, or making decisions?: No Patient able to express need for assistance with ADLs?: Yes Does the patient have difficulty dressing or bathing?: No Independently performs ADLs?: Yes (appropriate for developmental age) Does the patient have difficulty walking or climbing stairs?: No Weakness of Legs: None Weakness of Arms/Hands: None  Home  Assistive Devices/Equipment Home Assistive Devices/Equipment: None  Therapy Consults (therapy consults require a physician order) PT Evaluation Needed: No OT Evalulation Needed: No SLP Evaluation Needed: No Abuse/Neglect Assessment (Assessment to be complete while patient is alone) Abuse/Neglect Assessment Can Be Completed: Yes Physical Abuse:  (Pt states he is "unsure") Verbal Abuse:  (Pt states he is "unsure") Sexual Abuse:  (Pt states he is "unsure") Exploitation of patient/patient's resources: Denies Self-Neglect: Denies Values / Beliefs Cultural Requests During Hospitalization: None Spiritual Requests During Hospitalization: None Consults Spiritual Care Consult Needed: No Transition of Care Team Consult Needed: No Advance Directives (For Healthcare) Does Patient Have a Medical Advance Directive?: No Would patient like information on creating a medical advance directive?: No - Patient declined          Disposition: Nira Conn, NP, reviewed pt's chart and information and determined pt meets inpatient criteria. Pt is currently being reviewed by [facility]. This information was given to pt's providers at [phone].   Disposition Initial Assessment Completed for this Encounter: Yes  This service was provided via telemedicine using a 2-way, interactive audio and video technology.  Names of all persons participating in this telemedicine service and their role in this encounter. Name: Jhovani Griswold Role: Patient  Name: Barbara Cower  Allyson Sabal Role: Nurse Practitioner  Name: Kerman Passey Role: Clinician    Ralph Dowdy 03/11/2020 6:21 AM

## 2020-03-11 NOTE — Progress Notes (Signed)
Pt accepted to [facility], bed 507-2  Nira Conn, NP is the accepting provider.    Dr. Jola Babinski is the attending provider.    Call report to [phone]    Destiny @ [facility] notified.     Pt is under IVC and will be transported by law enforcement  Pt is scheduled to arrive at [facility] at 5pm.     Wells Guiles, MSW, LCSW, LCAS Clinical Social Worker II Disposition CSW [phone]

## 2020-03-11 NOTE — ED Notes (Signed)
Pt again became verbally aggressive attempting to leave the facility. Pt able to be redirected with security, [employer] called to assist with pt. Pt continues to demand different items. Medications received and given, with multiple staff members, security, and [employer].

## 2020-03-11 NOTE — ED Notes (Signed)
Report called to [place], RN @ [facility]

## 2020-03-11 NOTE — ED Provider Notes (Signed)
Patient has become agitated, refusing oral medications, threatening sitter and security.  He needed to be sedated for protection of staff and for his protection as well.  He is given an injection of ziprasidone.  2:41 AM Patient reassessed, resting comfortably, awake and alert, and no longer aggressive.  CRITICAL CARE Performed by: Dione Booze Total critical care time: 40 minutes Critical care time was exclusive of separately billable procedures and treating other patients. Critical care was necessary to treat or prevent imminent or life-threatening deterioration. Critical care was time spent personally by me on the following activities: development of treatment plan with patient and/or surrogate as well as nursing, discussions with consultants, evaluation of patient's response to treatment, examination of patient, obtaining history from patient or surrogate, ordering and performing treatments and interventions, ordering and review of laboratory studies, ordering and review of radiographic studies, pulse oximetry and re-evaluation of patient's condition.   Dione Booze, MD 03/11/20 [phone]

## 2020-03-12 DIAGNOSIS — F200 Paranoid schizophrenia: Secondary | ICD-10-CM | POA: Diagnosis not present

## 2020-03-12 LAB — LIPID PANEL
Cholesterol: 214 mg/dL — ABNORMAL HIGH (ref 0–200)
HDL: 35 mg/dL — ABNORMAL LOW (ref >40)
LDL Cholesterol: 153 mg/dL — ABNORMAL HIGH (ref 0–99)
Total CHOL/HDL Ratio: 6.1 RATIO
Triglycerides: 131 mg/dL (ref <150)
VLDL: 26 mg/dL (ref 0–40)

## 2020-03-12 LAB — TSH: TSH: 1.302 u[IU]/mL (ref 0.350–4.500)

## 2020-03-12 MED ORDER — RISPERIDONE 1 MG PO TBDP
1.0000 mg | ORAL_TABLET | Freq: Every day | ORAL | Status: DC
  Filled 2020-03-12 (×2): qty 1

## 2020-03-12 MED ORDER — CYCLOBENZAPRINE HCL 10 MG PO TABS
5.0000 mg | ORAL_TABLET | Freq: Three times a day (TID) | ORAL | Status: DC | PRN
  Filled 2020-03-12: qty 1

## 2020-03-12 MED ORDER — RISPERIDONE 0.5 MG PO TBDP
0.5000 mg | ORAL_TABLET | Freq: Every day | ORAL | Status: DC
  Administered 2020-03-12: 0.5 mg via ORAL
  Filled 2020-03-12 (×4): qty 1

## 2020-03-12 MED ORDER — LURASIDONE HCL 60 MG PO TABS
60.0000 mg | ORAL_TABLET | Freq: Every day | ORAL | Status: DC
  Administered 2020-03-12: 60 mg via ORAL
  Filled 2020-03-12: qty 3
  Filled 2020-03-12: qty 1
  Filled 2020-03-12 (×3): qty 3

## 2020-03-12 MED ORDER — TRAZODONE HCL 50 MG PO TABS
50.0000 mg | ORAL_TABLET | Freq: Every evening | ORAL | Status: DC | PRN
  Administered 2020-03-12 – 2020-03-14 (×2): 50 mg via ORAL
  Filled 2020-03-12 (×2): qty 1

## 2020-03-12 MED ORDER — POTASSIUM CHLORIDE CRYS ER 20 MEQ PO TBCR
20.0000 meq | EXTENDED_RELEASE_TABLET | Freq: Two times a day (BID) | ORAL | Status: AC
  Administered 2020-03-12 (×2): 20 meq via ORAL
  Filled 2020-03-12 (×3): qty 1

## 2020-03-12 MED ORDER — LURASIDONE HCL 40 MG PO TABS
40.0000 mg | ORAL_TABLET | Freq: Every day | ORAL | Status: DC
  Administered 2020-03-13: 40 mg via ORAL
  Filled 2020-03-12 (×3): qty 1

## 2020-03-12 NOTE — Progress Notes (Signed)
Malik Blake is a 54 y.o. male involuntary admitted from [facility]. Pt stated there has been misunderstanding between and his daughters mother. Pt stated he knows his family want him in the hospital because they think he needs help. Pt stated he is on disability due to back injury, pt stays at home most of the time doing nothing. Pt has been calm and cooperative with the admission process, alert and oriented, denied SI/HI and contracted for safety. Consents signed, skin/belongings search completed and pt oriented to unit. Pt stable at this time. Pt given the opportunity to express concerns and ask questions. Pt given toiletries. Will continue to monitor.

## 2020-03-12 NOTE — [facility] Suicide Risk Assessment (Signed)
[facility] INPATIENT:  Family/Significant Other Suicide Prevention Education  Suicide Prevention Education:  Education Completed; Kathie Rhodes Helvie [phone] (Mother)  has been identified by the patient as the family member/significant other with whom the patient will be residing, and identified as the person(s) who will aid the patient in the event of a mental health crisis (suicidal ideations/suicide attempt).  With written consent from the patient, the family member/significant other has been provided the following suicide prevention education, prior to the and/or following the discharge of the patient.  The suicide prevention education provided includes the following:  Suicide risk factors  Suicide prevention and interventions  National Suicide Hotline telephone number  [facility] assessment telephone number  [facility] Emergency Assistance 911  [facility] and/or Residential Mobile Crisis Unit telephone number  Request made of family/significant other to:  Remove weapons (e.g., guns, rifles, knives), all items previously/currently identified as safety concern.    Remove drugs/medications (over-the-counter, prescriptions, illicit drugs), all items previously/currently identified as a safety concern.  The family member/significant other verbalizes understanding of the suicide prevention education information provided.  The family member/significant other agrees to remove the items of safety concern listed above.   Ms. Prats states that her son was diagnosed several years ago with Paranoid Schizophrenia and she feels like his recent behaviors are due to him not taking his medications.  Ms. Ducat states that her son has not been taking his medications since July of 2021 because he did not like how much it made him sleep and how it lowered his sex drive.  Ms. Kruckenberg states that her son lives alone but has stayed with her for the last week and a half because he is to paranoid  to stay at his own home.  Ms. Savannah states that she and her son were diagnosed with Covid on 02/13/20 and that he continues to have a cough and lowered since of taste and smell from it.  Ms. Custis also states that her son has not been sleeping.  She would like him to get back on his medications and to go back to [facility] for outpatient treatment.  Ms. Piccione states that there are no weapons or firearms in her son's home and that she is able to tell when he has not been taking his medications.  CSW reviewed SPE with Ms. Goldring.     Metro Kung Saundra Gin 03/12/2020, 12:52 PM

## 2020-03-12 NOTE — BHH Suicide Risk Assessment (Signed)
[facility] Admission Suicide Risk Assessment   Nursing information obtained from:  Patient Demographic factors:  Male, Divorced or widowed, Caucasian, Unemployed, Living alone Current Mental Status:  NA Loss Factors:  Financial problems / change in socioeconomic status Historical Factors:  NA Risk Reduction Factors:  Positive social support, Responsible for children under 53 years of age  Total Time spent with patient: 30 minutes Principal Problem: <principal problem not specified> Diagnosis:  Active Problems:   Schizophrenia (HCC)  Subjective Data: Patient is seen and examined.  Patient is a 54 year old male with a reported past psychiatric history significant for schizophrenia who presented to the [facility] emergency department on 03/05/2020 with feelings that "someone is trying to poison my food".  He stated at that time that he felt as though he had stuff running through his veins that should not be there.  He stated that both his arms and legs feel like there were toxins running in them.  Shortly after his arrival he told him that he wanted to leave and that he was feeling better.  He denied any suicidal or homicidal ideation.  Apparently he was released.  The patient then presented again on 03/10/2020 under involuntary commitment.  He had a reported history of paranoid schizophrenia, and was not taking his medications.  The patient apparently reported at that time that he was told that he did not have to take them.  He also stated "I am not out to get anyone".  The behavioral health assessment that was done stated that he was not able to see his daughter as frequently as he like.  On my interview today he started on then went through a long dialogue.  The patient stated he had spoken to the daughter's caretaker, and she had apparently asked if he had been taking his medications.  After that she went to the sheriff's department and made what he calls were some untrue statements.  He stated he had  previously been followed at [facility], but had not followed up in the last 6 months.  He also stated that his last psychiatric hospitalization was in 2007.  [facility] apparently was [facility] at that time.  On interview he did admit that he had contacted the mother the child and told her that he was hearing voices.  The voices were telling him to do bad things.  He also told her that he thought she was poisoning him.  The decision was made to admit the patient to the psychiatric hospital for evaluation and stabilization.  Review of the electronic medical record revealed that in 2019 he was taking Latuda 60 mg p.o. daily, as well as omega-3 fatty acids.  He stated that that medication was not effective, and he did not sleep well with that.  Prior to that he had been on haloperidol.  A progress note from 01/05/2016 showed that he was taking 1-1/2 3 mg Haldol tablets at bedtime.  He stated that previous medications he had been given similar to Haldol led to him "sleeping all day long".  He stated that when he was at [facility] he had been tried on several medications, but was unable to remember exactly what those medications were.  He denied any suicidal or homicidal ideation.  Continued Clinical Symptoms:  Alcohol Use Disorder Identification Test Final Score (AUDIT): 0 The "Alcohol Use Disorders Identification Test", Guidelines for Use in Primary Care, Second Edition.  World Science writer [facility]). Score between 0-7:  no  or low risk or alcohol related problems. Score between 8-15:  moderate risk of alcohol related problems. Score between 16-19:  high risk of alcohol related problems. Score 20 or above:  warrants further diagnostic evaluation for alcohol dependence and treatment.   CLINICAL FACTORS:   Schizophrenia:   Command hallucinatons Paranoid or undifferentiated type   Musculoskeletal: Strength & Muscle Tone: within normal limits Gait & Station: normal Patient  leans: N/A  Psychiatric Specialty Exam: Physical Exam Vitals and nursing note reviewed.  Constitutional:      Appearance: Normal appearance.  HENT:     Head: Normocephalic and atraumatic.  Pulmonary:     Effort: Pulmonary effort is normal.  Neurological:     General: No focal deficit present.     Mental Status: He is alert and oriented to person, place, and time.     Review of Systems  Blood pressure 119/88, pulse 82, temperature 98.3 F (36.8 C), temperature source Oral, resp. rate 18, height 6\' 1"  (1.854 m), weight 115.7 kg, SpO2 100 %.Body mass index is 33.64 kg/m.  General Appearance: Casual  Eye Contact:  Fair  Speech:  Pressured  Volume:  Normal  Mood:  Anxious and Dysphoric  Affect:  Congruent  Thought Process:  Coherent and Descriptions of Associations: Circumstantial  Orientation:  Full (Time, Place, and Person)  Thought Content:  Delusions, Hallucinations: Auditory and Paranoid Ideation  Suicidal Thoughts:  No  Homicidal Thoughts:  No  Memory:  Immediate;   Fair Recent;   Fair Remote;   Fair  Judgement:  Fair  Insight:  Fair  Psychomotor Activity:  Normal  Concentration:  Concentration: Good and Attention Span: Good  Recall:  Good  Fund of Knowledge:  Good  Language:  Good  Akathisia:  Negative  Handed:  Right  AIMS (if indicated):     Assets:  Desire for Improvement Resilience  ADL's:  Intact  Cognition:  WNL  Sleep:         COGNITIVE FEATURES THAT CONTRIBUTE TO RISK:  None    SUICIDE RISK:   Moderate:  Frequent suicidal ideation with limited intensity, and duration, some specificity in terms of plans, no associated intent, good self-control, limited dysphoria/symptomatology, some risk factors present, and identifiable protective factors, including available and accessible social support.  PLAN OF CARE: Patient is seen and examined.  Patient is a 54 year old male with a reported past psychiatric history significant for schizophrenia who apparently  has been off his medications for at least 4 to 6 months.  He was admitted secondary to auditory hallucinations and paranoia.  He will be admitted to the hospital.  He will be integrated in the milieu.  He will be encouraged to attend groups.  He does seem to recall being on Risperdal in the past.  We will try Risperdal half a milligram p.o. daily and 1 mg p.o. nightly.  This to be titrated during the course of the hospitalization.  He will also have available hydroxyzine for anxiety, and trazodone for sleep.  We will continue his simvastatin for his hyperlipidemia.  He does apparently have some back issues, and I will write for Flexeril 5 mg p.o. every 8 hours as needed muscle spasm.  Review of his admission laboratories revealed mildly low potassium at 3.4.  This will be supplemented.  His creatinine was normal at 0.94.  Liver function enzymes were not done.  His total cholesterol from 10/14 showed a total cholesterol of 214 and an LDL of 153.  Triglycerides were 131.  CBC was normal.  TSH was normal at 1.302.  Influenza panel and coronavirus were both negative.  Blood alcohol was less than 10.  Drug screen was negative.  We will write for an EKG today.  We will also attempt to get collateral information from his daughter's caretaker.  I certify that inpatient services furnished can reasonably be expected to improve the patient's condition.   Antonieta Pert, MD 03/12/2020, 9:55 AM

## 2020-03-12 NOTE — Tx Team (Signed)
Initial Treatment Plan 03/12/2020 12:00 AM Malik Blake WKG:[id number]    PATIENT STRESSORS: Financial difficulties Marital or family conflict Medication change or noncompliance   PATIENT STRENGTHS: Ability for insight Capable of independent living Communication skills Supportive family/friends   PATIENT IDENTIFIED PROBLEMS: Depression  Anxiety  "Be on the right medication"                 DISCHARGE CRITERIA:  Ability to meet basic life and health needs Improved stabilization in mood, thinking, and/or behavior Medical problems require only outpatient monitoring Motivation to continue treatment in a less acute level of care  PRELIMINARY DISCHARGE PLAN: Attend aftercare/continuing care group Attend PHP/IOP Outpatient therapy Return to previous living arrangement  PATIENT/FAMILY INVOLVEMENT: This treatment plan has been presented to and reviewed with the patient, Malik Blake, and/or family member.  The patient and family have been given the opportunity to ask questions and make suggestions.  Bethann Punches, RN 03/12/2020, 12:00 AM

## 2020-03-12 NOTE — BHH Counselor (Signed)
Adult Comprehensive Assessment  Patient ID: Malik Blake, male   DOB: [birth year], 54 y.o.   MRN: [id number]  Information Source: Information source: Patient  Current Stressors:  Patient states their primary concerns and needs for treatment are:: "My child's mother made false accusations" Patient states their goals for this hospitilization and ongoing recovery are:: "To get back on my medication" Educational / Learning stressors: Pt reports no stressors Employment / Job issues: Pt reports that he has been on SSDI since 2008 Family Relationships: Pt reports a stressful relationship with his father and the mother of his children Surveyor, quantity / Lack of resources (include bankruptcy): Pt reports that he is on disability and has no Presenter, broadcasting / Lack of housing: Pt reports that he lives alone but has ben staying with his mother for approximately 1 month since being diagnosed with Covid. Physical health (include injuries & life threatening diseases): Pt reprots previous back conditions Social relationships: Pt has few social relationships Substance abuse: Pt reports drinking alcohol occasionally Bereavement / Loss: Pt reports no stresssors  Living/Environment/Situation:  Living Arrangements: Alone, Other relatives Living conditions (as described by patient or guardian): Pt has his own home but has been staying wtih his mother for appoximately one month Who else lives in the home?: Mother How long has patient lived in current situation?: Pt has been in his own home for "several years" What is atmosphere in current home: Comfortable, Paramedic, Temporary  Family History:  Marital status: Divorced Divorced, when?: 2002 What types of issues is patient dealing with in the relationship?: Pt did not disclose Additional relationship information: Pt reports that he is single Are you sexually active?: No What is your sexual orientation?: Heterosexual Has your sexual activity been affected by drugs,  alcohol, medication, or emotional stress?: Yes "the medication doesnt sit right with me" Does patient have children?: Yes How many children?: 2 How is patient's relationship with their children?: "It use to be exceptional but now my daughter wont talk to me much because of her mother"  Childhood History:  By whom was/is the patient raised?: Both parents Additional childhood history information: Parents divorced after Pt was an adult Description of patient's relationship with caregiver when they were a child: "My father was the discipline figure but me and my mother were good" Patient's description of current relationship with people who raised him/her: "Things are good with me and my mother but I dont really talk to my dad" How were you disciplined when you got in trouble as a child/adolescent?: "Spankings with a switch" Does patient have siblings?: Yes Number of Siblings: 1 Description of patient's current relationship with siblings: "I have one brother but I dont talk to him very much" Did patient suffer any verbal/emotional/physical/sexual abuse as a child?: No Did patient suffer from severe childhood neglect?: No Has patient ever been sexually abused/assaulted/raped as an adolescent or adult?: No Was the patient ever a victim of a crime or a disaster?: No Witnessed domestic violence?: No Has patient been affected by domestic violence as an adult?: No  Education:  Highest grade of school patient has completed: 12th grade Currently a student?: No Learning disability?: No  Employment/Work Situation:   Employment situation: On disability Why is patient on disability: Mental health and back condition How long has patient been on disability: since 2008 Patient's job has been impacted by current illness: Yes Describe how patient's job has been impacted: Pt is on disability "I am suspicious of other people" What is the longest time  patient has a held a job?: 10 years 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" Has patient ever been in the [employer]?: No  Financial Resources:   Financial resources: Insurance claims handler Does patient have a Lawyer or guardian?: No  Alcohol/Substance Abuse:   What has been your use of drugs/alcohol within the last 12 months?: Pt reports occasionally drinking alcohol If attempted suicide, did drugs/alcohol play a role in this?: No Alcohol/Substance Abuse Treatment Hx: Denies past history Has alcohol/substance abuse ever caused legal problems?: No  Social Support System:   Conservation officer, nature Support System: Fair Museum/gallery exhibitions officer System: "my mother, son, father, and grandmother" Type of faith/religion: Ephriam Knuckles How does patient's faith help to cope with current illness?: "I use to go to church but now I watch it on TV"  Leisure/Recreation:   Do You Have Hobbies?: Yes Leisure and Hobbies: "Nothing is fun anymore"  Strengths/Needs:   What is the patient's perception of their strengths?: "I use to be good at sports and bow hunting but not since I hurt my shoulder" Patient states they can use these personal strengths during their treatment to contribute to their recovery: Pt did not specify Patient states these barriers may affect/interfere with their treatment: None Patient states these barriers may affect their return to the community: None  Discharge Plan:   Currently receiving community mental health services: Yes (From Whom) ([facility] in [place]) Patient states concerns and preferences for aftercare planning are: Pt would like to go back to [facility] but is concerned that they may not accept him back due to him missing appointments Patient states they will know when they are safe and ready for discharge when: "When the doctor tells me I can leave" Does patient have access to transportation?: Yes Does patient have financial barriers related to discharge medications?:  Yes Will patient be returning to same living situation after discharge?: Yes  Summary/Recommendations:   Summary and Recommendations (to be completed by the evaluator): Nixxon Faria is a 54 year old, Caucasian, male who was admitted to the hospital due to paranoia.  The Pt reports that he is in the hospital due to a disagreement with the mother of his children.  The Pt reports that he has paranoid Schizophrenia and that he is aware that he should take his medications but states that he does not feel like he needs them.  The Pt reports living alone but staying with his mother for approximately the last month due to both individuals having Covid-19.  While in the hospital the Pt can benefit from crisis stabilization, medication evaluation, group therapy, psycho-education, case management, and discharge planning.  Upon discharge the Pt will return to his own home and will follow up with his previous therapist at [facility] in [place] for therapy and medication management.  Aram Beecham. 03/12/2020

## 2020-03-12 NOTE — H&P (Signed)
Psychiatric Admission Assessment Adult  Patient Identification: Malik Blake MRN:  [id number] Date of Evaluation:  03/12/2020 Chief Complaint:  Schizophrenia (HCC) [F20.9] Principal Diagnosis: <principal problem not specified> Diagnosis:  Active Problems:   Schizophrenia (HCC)  History of Present Illness: Patient is seen and examined.  Patient is a 54 year old male with a reported past psychiatric history significant for schizophrenia who presented to the [facility] emergency department on 03/05/2020 with feelings that "someone is trying to poison my food".  He stated at that time that he felt as though he had stuff running through his veins that should not be there.  He stated that both his arms and legs feel like there were toxins running in them.  Shortly after his arrival he told him that he wanted to leave and that he was feeling better.  He denied any suicidal or homicidal ideation.  Apparently he was released.  The patient then presented again on 03/10/2020 under involuntary commitment.  He had a reported history of paranoid schizophrenia, and was not taking his medications.  The patient apparently reported at that time that he was told that he did not have to take them.  He also stated "I am not out to get anyone".  The behavioral health assessment that was done stated that he was not able to see his daughter as frequently as he like.  On my interview today he started on then went through a long dialogue.  The patient stated he had spoken to the daughter's caretaker, and she had apparently asked if he had been taking his medications.  After that she went to the sheriff's department and made what he calls were some untrue statements.  He stated he had previously been followed at [facility], but had not followed up in the last 6 months.  He also stated that his last psychiatric hospitalization was in 2007.  [facility] apparently was [facility] at that time.  On interview he did  admit that he had contacted the mother the child and told her that he was hearing voices.  The voices were telling him to do bad things.  He also told her that he thought she was poisoning him.  The decision was made to admit the patient to the psychiatric hospital for evaluation and stabilization.  Review of the electronic medical record revealed that in 2019 he was taking Latuda 60 mg p.o. daily, as well as omega-3 fatty acids.  He stated that that medication was not effective, and he did not sleep well with that.  Prior to that he had been on haloperidol.  A progress note from 01/05/2016 showed that he was taking 1-1/2 3 mg Haldol tablets at bedtime.  He stated that previous medications he had been given similar to Haldol led to him "sleeping all day long".  He stated that when he was at [facility] he had been tried on several medications, but was unable to remember exactly what those medications were.  He denied any suicidal or homicidal ideation.  Associated Signs/Symptoms: Depression Symptoms:  insomnia, psychomotor agitation, anxiety, disturbed sleep, Duration of Depression Symptoms: No data recorded (Hypo) Manic Symptoms:  Delusions, Hallucinations, Impulsivity, Irritable Mood, Labiality of Mood, Anxiety Symptoms:  Excessive Worry, Psychotic Symptoms:  Delusions, Hallucinations: Auditory Paranoia, Duration of Psychotic Symptoms: No data recorded PTSD Symptoms: Negative Total Time spent with patient: 45 minutes  Past Psychiatric History: Patient has been followed locally at [facility].  He was last seen there approximately  6 months ago.  He stopped his medications at approximately that time as well.  He told me that the doctor told him that "to stop the medicine".  He was taking an unspecified dosage of Latuda at that time.  His last psychiatric hospitalization was at [facility] at [facility] 2007.  He also has been previously treated with  Risperdal as well as haloperidol.  Is the patient at risk to self? Yes.    Has the patient been a risk to self in the past 6 months? Yes.    Has the patient been a risk to self within the distant past? Yes.    Is the patient a risk to others? Yes.    Has the patient been a risk to others in the past 6 months? No.  Has the patient been a risk to others within the distant past? No.   Prior Inpatient Therapy:   Prior Outpatient Therapy:    Alcohol Screening: 1. How often do you have a drink containing alcohol?: Never 2. How many drinks containing alcohol do you have on a typical day when you are drinking?: 1 or 2 3. How often do you have six or more drinks on one occasion?: Never AUDIT-C Score: 0 4. How often during the last year have you found that you were not able to stop drinking once you had started?: Never 5. How often during the last year have you failed to do what was normally expected from you because of drinking?: Never 6. How often during the last year have you needed a first drink in the morning to get yourself going after a heavy drinking session?: Never 7. How often during the last year have you had a feeling of guilt of remorse after drinking?: Never 8. How often during the last year have you been unable to remember what happened the night before because you had been drinking?: Never 9. Have you or someone else been injured as a result of your drinking?: No 10. Has a relative or friend or a doctor or another health worker been concerned about your drinking or suggested you cut down?: No Alcohol Use Disorder Identification Test Final Score (AUDIT): 0 Alcohol Brief Interventions/Follow-up: AUDIT Score <7 follow-up not indicated Substance Abuse History in the last 12 months:  No. Consequences of Substance Abuse: Negative Previous Psychotropic Medications: Yes  Psychological Evaluations: Yes  Past Medical History:  Past Medical History:  Diagnosis Date  . High cholesterol    . Paranoid schizophrenia [facility])     Past Surgical History:  Procedure Laterality Date  . TONSILLECTOMY    . TONSILLECTOMY     Family History:  Family History  Problem Relation Age of Onset  . Cancer Mother        breast  . Cancer Father        prostate   Family Psychiatric  History: Noncontributory Tobacco Screening: Have you used any form of tobacco in the last 30 days? (Cigarettes, Smokeless Tobacco, Cigars, and/or Pipes): No Social History:  Social History   Substance and Sexual Activity  Alcohol Use Yes   Comment: occasionally     Social History   Substance and Sexual Activity  Drug Use No    Additional Social History: Marital status: Divorced Divorced, when?: 2002 What types of issues is patient dealing with in the relationship?: Pt did not disclose Additional relationship information: Pt reports that he is single Are you sexually active?: No What is your sexual  orientation?: Heterosexual Has your sexual activity been affected by drugs, alcohol, medication, or emotional stress?: Yes "the medication doesnt sit right with me" Does patient have children?: Yes How many children?: 2 How is patient's relationship with their children?: "It use to be exceptional but now my daughter wont talk to me much because of her mother"    Pain Medications: See MAr Prescriptions: See MAr Over the Counter: See MAR History of alcohol / drug use?: No history of alcohol / drug abuse                    Allergies:  No Known Allergies Lab Results:  Results for orders placed or performed during the hospital encounter of 03/11/20 (from the past 48 hour(s))  Lipid panel     Status: Abnormal   Collection Time: 03/12/20  6:14 AM  Result Value Ref Range   Cholesterol 214 (H) 0 - 200 mg/dL   Triglycerides 202 <542 mg/dL   HDL 35 (L) >70 mg/dL   Total CHOL/HDL Ratio 6.1 RATIO   VLDL 26 0 - 40 mg/dL   LDL Cholesterol 623 (H) 0 - 99 mg/dL    Comment:        Total  Cholesterol/HDL:CHD Risk Coronary Heart Disease Risk Table                     Men   Women  1/2 Average Risk   3.4   3.3  Average Risk       5.0   4.4  2 X Average Risk   9.6   7.1  3 X Average Risk  23.4   11.0        Use the calculated Patient Ratio above and the CHD Risk Table to determine the patient's CHD Risk.        ATP III CLASSIFICATION (LDL):  <100     mg/dL   Optimal  762-831  mg/dL   Near or Above                    Optimal  130-159  mg/dL   Borderline  517-616  mg/dL   High  >073     mg/dL   Very High Performed at [facility], [address], [place], [place] [address]   TSH     Status: None   Collection Time: 03/12/20  6:14 AM  Result Value Ref Range   TSH 1.302 0.350 - 4.500 uIU/mL    Comment: Performed by a 3rd Generation assay with a functional sensitivity of <=0.01 uIU/mL. Performed at [facility], [address], [place], [place] [address]     Blood Alcohol level:  Lab Results  Component Value Date   ETH <10 03/10/2020    Metabolic Disorder Labs:  No results found for: HGBA1C, MPG No results found for: PROLACTIN Lab Results  Component Value Date   CHOL 214 (H) 03/12/2020   TRIG 131 03/12/2020   HDL 35 (L) 03/12/2020   CHOLHDL 6.1 03/12/2020   VLDL 26 03/12/2020   LDLCALC 153 (H) 03/12/2020   LDLCALC 132 (H) 10/17/2019    Current Medications: Current Facility-Administered Medications  Medication Dose Route Frequency Provider Last Rate Last Admin  . cyclobenzaprine (FLEXERIL) tablet 5 mg  5 mg Oral TID PRN Antonieta Pert, MD      . Melene Muller ON 03/13/2020] lurasidone (LATUDA) tablet 40 mg  40 mg Oral Q breakfast Jola Babinski Marlane Mingle, MD      .  Lurasidone HCl TABS 60 mg  60 mg Oral Q supper Antonieta Pertlary, Javonta Gronau Lawson, MD      . potassium chloride SA (KLOR-CON) CR tablet 20 mEq  20 mEq Oral BID Antonieta Pertlary, Tris Howell Lawson, MD   20 mEq at 03/12/20 1320  . simvastatin (ZOCOR) tablet 20 mg  20 mg Oral QHS Denzil Magnusonhomas, Lashunda, NP       . traZODone (DESYREL) tablet 50 mg  50 mg Oral QHS PRN Antonieta Pertlary, Kayce Chismar Lawson, MD       PTA Medications: Medications Prior to Admission  Medication Sig Dispense Refill Last Dose  . ibuprofen (ADVIL) 200 MG tablet Take 400 mg by mouth every 6 (six) hours as needed. (Patient not taking: Reported on 03/12/2020)   Not Taking at Unknown time  . Lurasidone HCl (LATUDA) 60 MG TABS Take 1 tablet by mouth daily. (Patient not taking: Reported on 03/12/2020)   Not Taking at Unknown time  . OLANZapine (ZYPREXA) 5 MG tablet Take 5 mg by mouth at bedtime. (Patient not taking: Reported on 03/12/2020)   Not Taking at Unknown time  . Omega-3 Fatty Acids (FISH OIL PO) Take 2 tablets by mouth at bedtime.  (Patient not taking: Reported on 03/12/2020)   Not Taking at Unknown time  . simvastatin (ZOCOR) 20 MG tablet Take 1 tablet (20 mg total) by mouth at bedtime. (Patient not taking: Reported on 03/12/2020) 30 tablet 7 Not Taking at Unknown time    Musculoskeletal: Strength & Muscle Tone: within normal limits Gait & Station: normal Patient leans: N/A  Psychiatric Specialty Exam: Physical Exam Vitals reviewed.  Constitutional:      Appearance: Normal appearance.  HENT:     Head: Normocephalic and atraumatic.  Pulmonary:     Effort: Pulmonary effort is normal.  Neurological:     General: No focal deficit present.     Mental Status: He is alert and oriented to person, place, and time.     Review of Systems  Blood pressure 119/88, pulse 82, temperature 98.3 F (36.8 C), temperature source Oral, resp. rate 18, height 6\' 1"  (1.854 m), weight 115.7 kg, SpO2 100 %.Body mass index is 33.64 kg/m.  General Appearance: Casual  Eye Contact:  Fair  Speech:  Normal Rate  Volume:  Normal  Mood:  Anxious and Dysphoric  Affect:  Congruent  Thought Process:  Coherent and Descriptions of Associations: Loose  Orientation:  Full (Time, Place, and Person)  Thought Content:  Delusions, Hallucinations: Auditory and  Paranoid Ideation  Suicidal Thoughts:  No  Homicidal Thoughts:  No  Memory:  Immediate;   Fair Recent;   Fair Remote;   Fair  Judgement:  Intact  Insight:  Lacking  Psychomotor Activity:  Normal  Concentration:  Concentration: Fair  Recall:  Fis[employer]Fair  Fund of Knowledge:  Fair  Language:  Good  Akathisia:  Negative  Handed:  Right  AIMS (if indicated):     Assets:  Desire for Improvement Housing Resilience Social Support  ADL's:  Intact  Cognition:  WNL  Sleep:       Treatment Plan Summary: Daily contact with patient to assess and evaluate symptoms and progress in treatment, Medication management and Plan : Patient is seen and examined.  Patient is a 54 year old male with a reported past psychiatric history significant for schizophrenia who apparently has been off his medications for at least 4 to 6 months.  He was admitted secondary to auditory hallucinations and paranoia.  He will be admitted to the hospital.  He  will be integrated in the milieu.  He will be encouraged to attend groups.  Initially we discussed going on Risperdal, and this was secondary to a note in the chart that said that his mother had stated he had stopped taking the Latuda secondary to sexual side effects.  I went back to talk to the patient about this.  He stated that he would rather be on Latuda.  We will start Latuda 40 mg p.o. daily and 60 mg p.o. nightly.  This to be titrated during the rest of the course of the hospitalization.  We will also provide hydroxyzine for anxiety and trazodone for sleep if necessary.  We will continue his simvastatin for his hyperlipidemia.  He apparently does have some back issues, and I will write for Flexeril 5 mg p.o. every 8 hours as needed muscle spasms.  Review of his admission laboratories revealed a mildly low potassium at 3.4.  This will be supplemented.  His creatinine was normal at 0.94.  Liver function enzymes were not done.  His total cholesterol from 10/14 showed a total  cholesterol of 214, and an LDL of 153.  Triglycerides were 131.  His CBC was essentially normal.  TSH was normal at 1.302.  Influenza panel and coronavirus both negative.  Blood alcohol was less than 10.  Drug screen was negative.  We will write for an EKG today.  We will also attempt to get collateral information from his daughter's caretaker as well as his mother.  Observation Level/Precautions:  15 minute checks  Laboratory:  Chemistry Profile  Psychotherapy:    Medications:    Consultations:    Discharge Concerns:    Estimated LOS:  Other:     Physician Treatment Plan for Primary Diagnosis: <principal problem not specified> Long Term Goal(s): Improvement in symptoms so as ready for discharge  Short Term Goals: Ability to identify changes in lifestyle to reduce recurrence of condition will improve, Ability to verbalize feelings will improve, Ability to demonstrate self-control will improve, Ability to identify and develop effective coping behaviors will improve, Ability to maintain clinical measurements within normal limits will improve and Compliance with prescribed medications will improve  Physician Treatment Plan for Secondary Diagnosis: Active Problems:   Schizophrenia (HCC)  Long Term Goal(s): Improvement in symptoms so as ready for discharge  Short Term Goals: Ability to identify changes in lifestyle to reduce recurrence of condition will improve, Ability to verbalize feelings will improve, Ability to demonstrate self-control will improve, Ability to identify and develop effective coping behaviors will improve, Ability to maintain clinical measurements within normal limits will improve and Compliance with prescribed medications will improve  I certify that inpatient services furnished can reasonably be expected to improve the patient's condition.    Antonieta Pert, MD 10/14/20215:18 PM

## 2020-03-13 DIAGNOSIS — M549 Dorsalgia, unspecified: Secondary | ICD-10-CM

## 2020-03-13 DIAGNOSIS — G8929 Other chronic pain: Secondary | ICD-10-CM | POA: Diagnosis not present

## 2020-03-13 DIAGNOSIS — F200 Paranoid schizophrenia: Secondary | ICD-10-CM | POA: Diagnosis not present

## 2020-03-13 LAB — PROLACTIN: Prolactin: 10.4 ng/mL (ref 4.0–15.2)

## 2020-03-13 LAB — HEMOGLOBIN A1C
Hgb A1c MFr Bld: 5.5 % (ref 4.8–5.6)
Mean Plasma Glucose: 111 mg/dL

## 2020-03-13 MED ORDER — LURASIDONE HCL 80 MG PO TABS
80.0000 mg | ORAL_TABLET | Freq: Every day | ORAL | Status: DC
  Administered 2020-03-13 – 2020-03-15 (×3): 80 mg via ORAL
  Filled 2020-03-13: qty 2
  Filled 2020-03-13: qty 1
  Filled 2020-03-13: qty 7
  Filled 2020-03-13: qty 1
  Filled 2020-03-13: qty 2
  Filled 2020-03-13: qty 7
  Filled 2020-03-13: qty 1
  Filled 2020-03-13: qty 2
  Filled 2020-03-13: qty 1

## 2020-03-13 NOTE — Tx Team (Signed)
Interdisciplinary Treatment and Diagnostic Plan Update  03/13/2020 Time of Session: 9:40am  Malik Blake MRN: [id number]  Principal Diagnosis: Schizophrenia [facility])  Secondary Diagnoses: Principal Problem:   Schizophrenia (HCC) Active Problems:   Chronic back pain   Current Medications:  Current Facility-Administered Medications  Medication Dose Route Frequency Provider Last Rate Last Admin  . cyclobenzaprine (FLEXERIL) tablet 5 mg  5 mg Oral TID PRN Antonieta Pert, MD      . lurasidone (LATUDA) tablet 40 mg  40 mg Oral Q breakfast Antonieta Pert, MD   40 mg at 03/13/20 0827  . lurasidone (LATUDA) tablet 60 mg  60 mg Oral Q supper Antonieta Pert, MD   60 mg at 03/12/20 1821  . simvastatin (ZOCOR) tablet 20 mg  20 mg Oral QHS Denzil Magnuson, NP      . traZODone (DESYREL) tablet 50 mg  50 mg Oral QHS PRN Antonieta Pert, MD   50 mg at 03/12/20 2142   PTA Medications: Medications Prior to Admission  Medication Sig Dispense Refill Last Dose  . ibuprofen (ADVIL) 200 MG tablet Take 400 mg by mouth every 6 (six) hours as needed. (Patient not taking: Reported on 03/12/2020)   Not Taking at Unknown time  . Lurasidone HCl (LATUDA) 60 MG TABS Take 1 tablet by mouth daily. (Patient not taking: Reported on 03/12/2020)   Not Taking at Unknown time  . OLANZapine (ZYPREXA) 5 MG tablet Take 5 mg by mouth at bedtime. (Patient not taking: Reported on 03/12/2020)   Not Taking at Unknown time  . Omega-3 Fatty Acids (FISH OIL PO) Take 2 tablets by mouth at bedtime.  (Patient not taking: Reported on 03/12/2020)   Not Taking at Unknown time  . simvastatin (ZOCOR) 20 MG tablet Take 1 tablet (20 mg total) by mouth at bedtime. (Patient not taking: Reported on 03/12/2020) 30 tablet 7 Not Taking at Unknown time    Patient Stressors: Financial difficulties Marital or family conflict Medication change or noncompliance  Patient Strengths: Ability for insight Capable of independent  living Communication skills Supportive family/friends  Treatment Modalities: Medication Management, Group therapy, Case management,  1 to 1 session with clinician, Psychoeducation, Recreational therapy.   Physician Treatment Plan for Primary Diagnosis: Schizophrenia (HCC) Long Term Goal(s): Improvement in symptoms so as ready for discharge Improvement in symptoms so as ready for discharge   Short Term Goals: Ability to identify changes in lifestyle to reduce recurrence of condition will improve Ability to verbalize feelings will improve Ability to demonstrate self-control will improve Ability to identify and develop effective coping behaviors will improve Ability to maintain clinical measurements within normal limits will improve Compliance with prescribed medications will improve Ability to identify changes in lifestyle to reduce recurrence of condition will improve Ability to verbalize feelings will improve Ability to demonstrate self-control will improve Ability to identify and develop effective coping behaviors will improve Ability to maintain clinical measurements within normal limits will improve Compliance with prescribed medications will improve  Medication Management: Evaluate patient's response, side effects, and tolerance of medication regimen.  Therapeutic Interventions: 1 to 1 sessions, Unit Group sessions and Medication administration.  Evaluation of Outcomes: Progressing  Physician Treatment Plan for Secondary Diagnosis: Principal Problem:   Schizophrenia (HCC) Active Problems:   Chronic back pain  Long Term Goal(s): Improvement in symptoms so as ready for discharge Improvement in symptoms so as ready for discharge   Short Term Goals: Ability to identify changes in lifestyle to reduce recurrence of  condition will improve Ability to verbalize feelings will improve Ability to demonstrate self-control will improve Ability to identify and develop effective coping  behaviors will improve Ability to maintain clinical measurements within normal limits will improve Compliance with prescribed medications will improve Ability to identify changes in lifestyle to reduce recurrence of condition will improve Ability to verbalize feelings will improve Ability to demonstrate self-control will improve Ability to identify and develop effective coping behaviors will improve Ability to maintain clinical measurements within normal limits will improve Compliance with prescribed medications will improve     Medication Management: Evaluate patient's response, side effects, and tolerance of medication regimen.  Therapeutic Interventions: 1 to 1 sessions, Unit Group sessions and Medication administration.  Evaluation of Outcomes: Progressing   RN Treatment Plan for Primary Diagnosis: Schizophrenia (HCC) Long Term Goal(s): Knowledge of disease and therapeutic regimen to maintain health will improve  Short Term Goals: Ability to remain free from injury will improve, Ability to demonstrate self-control, Ability to participate in decision making will improve, Ability to verbalize feelings will improve, Ability to identify and develop effective coping behaviors will improve and Compliance with prescribed medications will improve  Medication Management: RN will administer medications as ordered by provider, will assess and evaluate patient's response and provide education to patient for prescribed medication. RN will report any adverse and/or side effects to prescribing provider.  Therapeutic Interventions: 1 on 1 counseling sessions, Psychoeducation, Medication administration, Evaluate responses to treatment, Monitor vital signs and CBGs as ordered, Perform/monitor CIWA, COWS, AIMS and Fall Risk screenings as ordered, Perform wound care treatments as ordered.  Evaluation of Outcomes: Progressing   LCSW Treatment Plan for Primary Diagnosis: Schizophrenia (HCC) Long Term  Goal(s): Safe transition to appropriate next level of care at discharge, Engage patient in therapeutic group addressing interpersonal concerns.  Short Term Goals: Engage patient in aftercare planning with referrals and resources, Increase social support, Increase emotional regulation, Facilitate acceptance of mental health diagnosis and concerns, Identify triggers associated with mental health/substance abuse issues and Increase skills for wellness and recovery  Therapeutic Interventions: Assess for all discharge needs, 1 to 1 time with Social worker, Explore available resources and support systems, Assess for adequacy in community support network, Educate family and significant other(s) on suicide prevention, Complete Psychosocial Assessment, Interpersonal group therapy.  Evaluation of Outcomes: Progressing   Progress in Treatment: Attending groups: No. Participating in groups: No. Taking medication as prescribed: Yes. Toleration medication: Yes. Family/Significant other contact made: Yes, individual(s) contacted:  Mother Patient understands diagnosis: Yes. and No. Discussing patient identified problems/goals with staff: Yes. Medical problems stabilized or resolved: Yes. Denies suicidal/homicidal ideation: Yes. Issues/concerns per patient self-inventory: No.   New problem(s) identified: No, Describe:  None  New Short Term/Long Term Goal(s): medication stabilization, elimination of SI thoughts, development of comprehensive mental wellness plan.   Patient Goals:  "Settle back into society"  Discharge Plan or Barriers:  Patient recently admitted. CSW will continue to follow and assess for appropriate referrals and possible discharge planning.   Reason for Continuation of Hospitalization: Delusions  Depression Medication stabilization  Estimated Length of Stay: 3 to 5 days   Attendees: Patient: Malik Blake  03/13/2020   Physician: Gretta Cool, MD 03/13/2020   Nursing:   03/13/2020   RN Care Manager: 03/13/2020   Social Worker: Melba Coon, LCSW 03/13/2020   Recreational Therapist:  03/13/2020   Other:  03/13/2020   Other:  03/13/2020   Other: 03/13/2020      Scribe for Treatment Team: Lawanna Kobus  Lupita Raider, LCSWA 03/13/2020 11:12 AM

## 2020-03-13 NOTE — Progress Notes (Signed)
D: Patient presents with flat and depressed affect. Patient refused to take cholesterol medicine due to patient believing something was "not right" with him when he was taking cholesterol medicine at home. Patient denies SI/HI at this time. Patient also denies AH/VH at this time. Patient aware to notify staff or RN if this changes.  A: Provided patient education regarding medication and positive reinforcement.  R: Patient needs more education regarding medication.

## 2020-03-13 NOTE — Progress Notes (Signed)
   03/13/20 2348  Psych Admission Type (Psych Patients Only)  Admission Status Involuntary  Psychosocial Assessment  Patient Complaints Anxiety;Restlessness  Eye Contact Fair  Facial Expression Sad;Anxious  Affect Sad;Flat;Depressed  Speech Logical/coherent  Interaction Assertive  Motor Activity Slow  Appearance/Hygiene Unremarkable  Behavior Characteristics Cooperative  Mood Suspicious;Preoccupied  Thought Process  Coherency Disorganized  Content Delusions;Preoccupation;Paranoia  Delusions Paranoid  Perception Derealization  Hallucination Olfactory;Visual  Judgment Limited  Confusion Moderate  Danger to Self  Current suicidal ideation? Denies  Danger to Others  Danger to Others None reported or observed  D: Patient pacing up and down the hallways. Pt appears suspicious of his surroundings and is paranoid about been poisoned. Pt stated he see a male figure that looks like the person his partner had an inappriopriate relationship with. Pt stated he does not feel safe on the unit. A: Medications administered as prescribed. Support and encouragement provided as needed.  R: Patient remains safe on the unit. Will continue to monitor for safety and stability.

## 2020-03-13 NOTE — Progress Notes (Signed)
Pt did not attend wrap-up group   

## 2020-03-13 NOTE — Progress Notes (Signed)
Patient denies SI, HI and AVH this shift.  Patient has had no incidents of behavioral dyscontrol this shift.  Patient has been active in the milieu and was engaged appropriately with peers.    Assess patient for safety, offer medications as prescribed, engage patient in 1:1 staff talks.   Patient able to contract for safety continue to monitor as planned.  

## 2020-03-13 NOTE — Progress Notes (Signed)
[facility] MD Progress Note  03/13/2020 9:11 AM Malik Blake  MRN:  [id number] Subjective: "My mind has been going for a long long time." Principal Problem: Schizophrenia (HCC) Diagnosis: Principal Problem:   Schizophrenia (HCC) Active Problems:   Chronic back pain  Total Time spent with patient: 65 minutes  Past Psychiatric History: Schizophrenia  History of Present Illness: Patient is seen and examined. Patient is a 54 year old male with a reported past psychiatric history significant for schizophrenia who presented to the [facility] emergency department on 03/05/2020 with feelings that "someone is trying to poison my food". He stated at that time that he felt as though he had stuff running through his veins that should not be there. He stated that both his arms and legs feel like there were toxins running in them. Shortly after his arrival he told him that he wanted to leave and that he was feeling better. He denied any suicidal or homicidal ideation. Apparently he was released. The patient then presented again on 03/10/2020 under involuntary commitment. He had a reported history of paranoid schizophrenia, and was not taking his medications. The patient apparently reported at that time that he was told that he did not have to take them. He also stated "I am not out to get anyone". The behavioral health assessment that was done stated that he was not able to see his daughter as frequently as he like. On my interview today he started on then went through a long dialogue. The patient stated he had spoken to the daughter's caretaker, and she had apparently asked if he had been taking his medications. After that she went to the sheriff's department and made what he calls were some untrue statements. He stated he had previously been followed at [facility], but had not followed up in the last 6 months. He also stated that his last psychiatric hospitalization was in 2007. [facility] apparently [facility] at that time. On interview he did admit that he had contacted the mother the child and told her that he was hearing voices. The voices were telling him to do bad things. He also told her that he thought she was poisoning him. The decision was made to admit the patient to the psychiatric hospital for evaluation and stabilization. Review of the electronic medical record revealed that in 2019 he was taking Latuda 60 mg p.o. daily, as well as omega-3 fatty acids. He stated that that medication was not effective, and he did not sleep well with that. Prior to that he had been on haloperidol. A progress note from 01/05/2016 showed that he was taking 1-1/2 3 mg Haldol tablets at bedtime. He stated that previous medications he had been given similar to Haldol led to him "sleeping all day long". He stated that when he was at [facility] he had been tried on several medications, but was unable to remember exactly what those medications were. He denied any suicidal or homicidal ideation..   On evaluation today, patient initially presents with thought blocking, stating "my mind has been going for a long long time.  I need to learn how to settle back into society."  He has been compliant with his medications without side effects, and when seen later in the day.  He is much clearer.  He states that he has a history of schizophrenia dating back to 2007.  He recalls his hallucinations of seeing spirits and other things flying around the room, as well  as vague auditory hallucinations.  Patient recalls that while he was taking his medication he feels like he was doing well.  He was able to complete tasks around the house and interact appropriately.  He has been followed by Dr. Geanie Blake at [facility], however he currently dislikes the format for clinic visits via telehealth due to COVID-19.  He describes that at 1 point he was doing well and it was suggested that he could take a medication just  as needed.  From record review, it appears that Zyprexa was his as needed medication.  Patient states that he took this a few times, but it caused him to be in bed for 12 hours and he felt as if he could not function and perform his activities of daily living.  He states after that he stopped all medication.  He believes that he has been off of medication for approximately 6-8 months.  Patient recently had COVID-19 infection from which she has recovered.  He has been staying with his mother while he recovers and builds back his strength.  He has noted that he has had strange return of taste with metallic taste sensations which has caused him to become more paranoid.  He currently is denying any suicidal or homicidal ideation.  He denies auditory and visual hallucinations since admission.  Patient reports that yesterday he took Malik Blake with his dinner, where at home he previously took at bedtime.  He states he fell asleep quickly after dinner, but then awakened a few hours later and required additional medication to fall back to sleep.  He also took a daytime dose of Latuda 40 mg today, and notes that he felt very sedated and took a nap.  A family meeting is held with patient's mother, Caryl ComesBetty Blake who expresses concern for patient's hallucinations and bizarre behaviors.  She agrees that patient did do better while on medication, and is insistent that he return to taking regular medications.  Reviewed medication plan with patient and mother and will continue to engage Dr. In his discharge planning.  At this time reviewed with patient and mother plan to change Latuda to 80 mg at night and continue to monitor for return of hallucinations or paranoid behaviors.  Past Medical History:  Past Medical History:  Diagnosis Date  . High cholesterol   . Paranoid schizophrenia [facility]HCC)     Past Surgical History:  Procedure Laterality Date  . TONSILLECTOMY    . TONSILLECTOMY     Family History:  Family History   Problem Relation Age of Onset  . Cancer Mother        breast  . Cancer Father        prostate   Family Psychiatric  History: See H&P  Social History:  Social History   Substance and Sexual Activity  Alcohol Use Yes   Comment: occasionally     Social History   Substance and Sexual Activity  Drug Use No    Social History   Socioeconomic History  . Marital status: Divorced    Spouse name: Not on file  . Number of children: Not on file  . Years of education: Not on file  . Highest education level: Not on file  Occupational History  . Not on file  Tobacco Use  . Smoking status: Never Smoker  . Smokeless tobacco: Never Used  Substance and Sexual Activity  . Alcohol use: Yes    Comment: occasionally  . Drug use: No  . Sexual activity: Not on  file  Other Topics Concern  . Not on file  Social History Narrative  . Not on file   Social Determinants of Health   Financial Resource Strain:   . Difficulty of Paying Living Expenses: Not on file  Food Insecurity:   . Worried About Programme researcher, broadcasting/film/video in the Last Year: Not on file  . Ran Out of Food in the Last Year: Not on file  Transportation Needs:   . Lack of Transportation (Medical): Not on file  . Lack of Transportation (Non-Medical): Not on file  Physical Activity:   . Days of Exercise per Week: Not on file  . Minutes of Exercise per Session: Not on file  Stress:   . Feeling of Stress : Not on file  Social Connections:   . Frequency of Communication with Friends and Family: Not on file  . Frequency of Social Gatherings with Friends and Family: Not on file  . Attends Religious Services: Not on file  . Active Member of Clubs or Organizations: Not on file  . Attends Banker Meetings: Not on file  . Marital Status: Not on file   Additional Social History:    Pain Medications: See MAr Prescriptions: See MAr Over the Counter: See MAR History of alcohol / drug use?: No history of alcohol / drug  abuse             Lives on his own, but will return to living with mother while he recovers from hospitalization.      Sleep: Good  Appetite:  Good  Current Medications: Current Facility-Administered Medications  Medication Dose Route Frequency Provider Last Rate Last Admin  . cyclobenzaprine (FLEXERIL) tablet 5 mg  5 mg Oral TID PRN Antonieta Pert, MD      . lurasidone (LATUDA) tablet 40 mg  40 mg Oral Q breakfast Antonieta Pert, MD   40 mg at 03/13/20 0827  . lurasidone (LATUDA) tablet 60 mg  60 mg Oral Q supper Antonieta Pert, MD   60 mg at 03/12/20 1821  . simvastatin (ZOCOR) tablet 20 mg  20 mg Oral QHS Denzil Magnuson, NP      . traZODone (DESYREL) tablet 50 mg  50 mg Oral QHS PRN Antonieta Pert, MD   50 mg at 03/12/20 2142    Lab Results:  Results for orders placed or performed during the hospital encounter of 03/11/20 (from the past 48 hour(s))  Hemoglobin A1c     Status: None   Collection Time: 03/12/20  6:14 AM  Result Value Ref Range   Hgb A1c MFr Bld 5.5 4.8 - 5.6 %    Comment: (NOTE)         Prediabetes: 5.7 - 6.4         Diabetes: >6.4         Glycemic control for adults with diabetes: <7.0    Mean Plasma Glucose 111 mg/dL    Comment: (NOTE) Performed At: [facility] [address] [place], [place] [id number] Jolene Schimke MD WU:[id number]   Lipid panel     Status: Abnormal   Collection Time: 03/12/20  6:14 AM  Result Value Ref Range   Cholesterol 214 (H) 0 - 200 mg/dL   Triglycerides 956 <213 mg/dL   HDL 35 (L) >08 mg/dL   Total CHOL/HDL Ratio 6.1 RATIO   VLDL 26 0 - 40 mg/dL   LDL Cholesterol 657 (H) 0 - 99 mg/dL    Comment:  Total Cholesterol/HDL:CHD Risk Coronary Heart Disease Risk Table                     Men   Women  1/2 Average Risk   3.4   3.3  Average Risk       5.0   4.4  2 X Average Risk   9.6   7.1  3 X Average Risk  23.4   11.0        Use the calculated Patient Ratio above and the CHD Risk  Table to determine the patient's CHD Risk.        ATP III CLASSIFICATION (LDL):  <100     mg/dL   Optimal  893-810  mg/dL   Near or Above                    Optimal  130-159  mg/dL   Borderline  175-102  mg/dL   High  >585     mg/dL   Very High Performed at [facility], [address], [place], [place] [address]   Prolactin     Status: None   Collection Time: 03/12/20  6:14 AM  Result Value Ref Range   Prolactin 10.4 4.0 - 15.2 ng/mL    Comment: (NOTE) Performed At: [facility] [address] [place], [place] [id number] Jolene Schimke MD RX:[id number]   TSH     Status: None   Collection Time: 03/12/20  6:14 AM  Result Value Ref Range   TSH 1.302 0.350 - 4.500 uIU/mL    Comment: Performed by a 3rd Generation assay with a functional sensitivity of <=0.01 uIU/mL. Performed at [facility], [address] [place], [place] [address]     Blood Alcohol level:  Lab Results  Component Value Date   ETH <10 03/10/2020    Metabolic Disorder Labs: Lab Results  Component Value Date   HGBA1C 5.5 03/12/2020   MPG 111 03/12/2020   Lab Results  Component Value Date   PROLACTIN 10.4 03/12/2020   Lab Results  Component Value Date   CHOL 214 (H) 03/12/2020   TRIG 131 03/12/2020   HDL 35 (L) 03/12/2020   CHOLHDL 6.1 03/12/2020   VLDL 26 03/12/2020   LDLCALC 153 (H) 03/12/2020   LDLCALC 132 (H) 10/17/2019    Physical Findings: AIMS: Facial and Oral Movements Muscles of Facial Expression: None, normal Lips and Perioral Area: None, normal Jaw: None, normal Tongue: None, normal,Extremity Movements Upper (arms, wrists, hands, fingers): None, normal Lower (legs, knees, ankles, toes): None, normal, Trunk Movements Neck, shoulders, hips: None, normal, Overall Severity Severity of abnormal movements (highest score from questions above): None, normal Incapacitation due to abnormal movements: None, normal Patient's awareness of  abnormal movements (rate only patient's report): No Awareness, Dental Status Current problems with teeth and/or dentures?: No Does patient usually wear dentures?: No  CIWA:  CIWA-Ar Total: 1 COWS:  COWS Total Score: 2  Musculoskeletal: Strength & Muscle Tone: within normal limits Gait & Station: normal Patient leans: N/A  Psychiatric Specialty Exam: Physical Exam Vitals and nursing note reviewed.  Constitutional:      General: He is in acute distress.     Appearance: Normal appearance. He is obese.  HENT:     Head: Normocephalic and atraumatic.     Nose: Nose normal.  Eyes:     Extraocular Movements: Extraocular movements intact.  Cardiovascular:     Rate and Rhythm: Normal rate.  Pulmonary:  Effort: Pulmonary effort is normal.  Musculoskeletal:        General: Normal range of motion.     Cervical back: Normal range of motion.  Neurological:     General: No focal deficit present.     Mental Status: He is alert and oriented to person, place, and time.     Review of Systems  Constitutional: Positive for fatigue.  HENT: Negative.   Respiratory: Negative.   Cardiovascular: Negative.   Gastrointestinal: Negative.   Musculoskeletal: Negative.   Neurological: Negative.   Psychiatric/Behavioral: Positive for confusion and dysphoric mood. Negative for agitation, behavioral problems, decreased concentration, hallucinations, self-injury, sleep disturbance and suicidal ideas. The patient is not nervous/anxious and is not hyperactive.     Blood pressure 129/72, pulse 98, temperature 98.3 F (36.8 C), temperature source Oral, resp. rate 18, height 6\' 1"  (1.854 m), weight 115.7 kg, SpO2 100 %.Body mass index is 33.64 kg/m.  General Appearance: Casual and Neat  Eye Contact:  Good  Speech:  Clear and Coherent and Normal Rate  Volume:  Normal  Mood:  Dysphoric  Affect:  Congruent and Constricted  Thought Process:  Goal Directed and Descriptions of Associations: Tangential   Orientation:  Full (Time, Place, and Person)  Thought Content:  Logical and Hallucinations: Visual  Suicidal Thoughts:  No  Homicidal Thoughts:  No  Memory:  Immediate;   Fair Recent;   Fair Remote;   Fair  Judgement:  Impaired  Insight:  Shallow  Psychomotor Activity:  Normal  Concentration:  Concentration: Fair and Attention Span: Fair  Recall:  of Knowledge:  Fair  Language:  Good  Akathisia:  No  Handed:  Right  AIMS (if indicated):     Assets:  Communication Skills Desire for Improvement Financial Resources/Insurance Housing Resilience Social Support  ADL's:  Intact  Cognition:  WNL  Sleep:  Number of Hours: 6.75     Treatment Plan Summary: Daily contact with patient to assess and evaluate symptoms and progress in treatment and Medication management Patient is seen and examined.  Patient is a 54 year old male with a reported past psychiatric history significant for schizophrenia who apparently has been off his medications for at least 4 to 6 months.  He was admitted secondary to auditory hallucinations and paranoia.  He will be admitted to the hospital.  He will be integrated in the milieu.  He will be encouraged to attend groups.  Initially we discussed going on Risperdal, and this was secondary to a note in the chart that said that his mother had stated he had stopped taking the Latuda secondary to sexual side effects.  I went back to talk to the patient about this.  He stated that he would rather be on Latuda.  We will start Latuda 40 mg p.o. daily and 60 mg p.o. nightly.  This to be titrated during the rest of the course of the hospitalization.  We will also provide hydroxyzine for anxiety and trazodone for sleep if necessary.  We will continue his simvastatin for his hyperlipidemia.  He apparently does have some back issues, and I will write for Flexeril 5 mg p.o. every 8 hours as needed muscle spasms.  Review of his admission laboratories revealed a mildly low  potassium at 3.4.  This will be supplemented.  His creatinine was normal at 0.94.  Liver function enzymes were not done.  His total cholesterol from 10/14 showed a total cholesterol of 214, and an LDL of 153.  Triglycerides  were 131.  His CBC was essentially normal.  TSH was normal at 1.302.  Influenza panel and coronavirus both negative.  Blood alcohol was less than 10.  Drug screen was negative.    Treatment Plan Summary: Daily contact with patient to assess and evaluate symptoms and progress in treatment   Treatment Plan/Recommendations: 1. Admit for crisis management and stabilization, estimated length of stay 3-5 days.  2. Medication management to reduce current symptoms to base line and improve the patient's overall level of functioning:  -Latuda 80 mg at bedtime for schizophrenia -Zocor 20 mg at bedtime for high cholesterol -Trazodone 50 mg at bedtime as needed for sleep -Cyclobenzaprine 5 mg 3 times daily as needed for muscle spasm. 3. Treat health problems as indicated.  4. Develop treatment plan to decrease risk of relapse upon discharge and the need for readmission.  5. Psycho-social education regarding relapse prevention and self care.  6. Health care follow up as needed for medical problems.  7. Review, reconcile, and reinstate any pertinent home medications for other health issues where appropriate. 8. Call for consults with hospitalist for any additional specialty patient care services as needed. 9.  Appreciate social work assistance in coordinating discharge plan   Mariel Craft, MD 03/13/2020, 9:11 AM

## 2020-03-14 DIAGNOSIS — M549 Dorsalgia, unspecified: Secondary | ICD-10-CM | POA: Diagnosis not present

## 2020-03-14 DIAGNOSIS — F200 Paranoid schizophrenia: Secondary | ICD-10-CM | POA: Diagnosis not present

## 2020-03-14 DIAGNOSIS — G8929 Other chronic pain: Secondary | ICD-10-CM | POA: Diagnosis not present

## 2020-03-14 LAB — BASIC METABOLIC PANEL
Anion gap: 13 (ref 5–15)
BUN: 12 mg/dL (ref 6–20)
CO2: 24 mmol/L (ref 22–32)
Calcium: 9.5 mg/dL (ref 8.9–10.3)
Chloride: 99 mmol/L (ref 98–111)
Creatinine, Ser: 0.97 mg/dL (ref 0.61–1.24)
GFR, Estimated: >60 mL/min (ref >60)
Glucose, Bld: 94 mg/dL (ref 70–99)
Potassium: 3.8 mmol/L (ref 3.5–5.1)
Sodium: 136 mmol/L (ref 135–145)

## 2020-03-14 MED ORDER — IBUPROFEN 600 MG PO TABS
600.0000 mg | ORAL_TABLET | Freq: Four times a day (QID) | ORAL | Status: DC | PRN
  Administered 2020-03-14 – 2020-03-15 (×2): 600 mg via ORAL
  Filled 2020-03-14 (×4): qty 1

## 2020-03-14 NOTE — Progress Notes (Addendum)
[facility] MD Progress Note  03/14/2020 6:22 PM Malik Blake  MRN:  [id number] Subjective: Patient is a 54 year old male with a reported past psychiatric history significant for schizophrenia who apparently has been off his medications for at least 4 to 6 months. He was admitted secondary to auditory hallucinations and paranoia. He has been staying with his mother for the past few weeks due to having COVID. He has a home in [place] where he will eventually return to live. He has a 50 year old daughter who is with her mother and he has some distress over not being able to see her much for the past year.  He admits to visual and auditory hallucinations. He sees spirits and movement in his home but they are not threatening to him. He stopped taking his medications several months ago and has decompensated. He stated he has a metallic taste in his mouth and has a belief or suspicion that the water may be poisoned. He did state that he is drinking water today and rationally knows it is okay but still wonders if it is poison. He is able to hold a logical, linear conversation for several minutes but then his thoughts go to delusions and he has difficulty staying on topic. His Latuda was increased to 80 mg  Last night, he believes it is working and does not want another increase yet. He is calm and cooperative.   Principal Problem: Schizophrenia (HCC) Diagnosis: Principal Problem:   Schizophrenia (HCC) Active Problems:   Chronic back pain  Total Time spent with patient: 25 minutes  Past Psychiatric History: See admission H&P  Past Medical History:  Past Medical History:  Diagnosis Date  . High cholesterol   . Paranoid schizophrenia [facility])     Past Surgical History:  Procedure Laterality Date  . TONSILLECTOMY    . TONSILLECTOMY     Family History:  Family History  Problem Relation Age of Onset  . Cancer Mother        breast  . Cancer Father        prostate   Family Psychiatric  History: See  admission H&P Social History:  Social History   Substance and Sexual Activity  Alcohol Use Yes   Comment: occasionally     Social History   Substance and Sexual Activity  Drug Use No    Social History   Socioeconomic History  . Marital status: Divorced    Spouse name: Not on file  . Number of children: Not on file  . Years of education: Not on file  . Highest education level: Not on file  Occupational History  . Not on file  Tobacco Use  . Smoking status: Never Smoker  . Smokeless tobacco: Never Used  Substance and Sexual Activity  . Alcohol use: Yes    Comment: occasionally  . Drug use: No  . Sexual activity: Not on file  Other Topics Concern  . Not on file  Social History Narrative  . Not on file   Social Determinants of Health   Financial Resource Strain:   . Difficulty of Paying Living Expenses: Not on file  Food Insecurity:   . Worried About Programme researcher, broadcasting/film/video in the Last Year: Not on file  . Ran Out of Food in the Last Year: Not on file  Transportation Needs:   . Lack of Transportation (Medical): Not on file  . Lack of Transportation (Non-Medical): Not on file  Physical Activity:   . Days of Exercise  per Week: Not on file  . Minutes of Exercise per Session: Not on file  Stress:   . Feeling of Stress : Not on file  Social Connections:   . Frequency of Communication with Friends and Family: Not on file  . Frequency of Social Gatherings with Friends and Family: Not on file  . Attends Religious Services: Not on file  . Active Member of Clubs or Organizations: Not on file  . Attends Banker Meetings: Not on file  . Marital Status: Not on file   Additional Social History:    Pain Medications: See MAr Prescriptions: See MAr Over the Counter: See MAR History of alcohol / drug use?: No history of alcohol / drug abuse                    Sleep: Fair  Appetite:  Fair  Current Medications: Current Facility-Administered Medications   Medication Dose Route Frequency Provider Last Rate Last Admin  . cyclobenzaprine (FLEXERIL) tablet 5 mg  5 mg Oral TID PRN Antonieta Pert, MD      . ibuprofen (ADVIL) tablet 600 mg  600 mg Oral Q6H PRN Laveda Abbe, NP   600 mg at 03/14/20 1504  . lurasidone (LATUDA) tablet 80 mg  80 mg Oral QHS Malik Craft, MD   80 mg at 03/13/20 2117  . simvastatin (ZOCOR) tablet 20 mg  20 mg Oral QHS Denzil Magnuson, NP      . traZODone (DESYREL) tablet 50 mg  50 mg Oral QHS PRN Antonieta Pert, MD   50 mg at 03/12/20 2142    Lab Results:  Results for orders placed or performed during the hospital encounter of 03/11/20 (from the past 48 hour(s))  Basic metabolic panel     Status: None   Collection Time: 03/14/20  9:53 AM  Result Value Ref Range   Sodium 136 135 - 145 mmol/L   Potassium 3.8 3.5 - 5.1 mmol/L   Chloride 99 98 - 111 mmol/L   CO2 24 22 - 32 mmol/L   Glucose, Bld 94 70 - 99 mg/dL    Comment: Glucose reference range applies only to samples taken after fasting for at least 8 hours.   BUN 12 6 - 20 mg/dL   Creatinine, Ser 2.09 0.61 - 1.24 mg/dL   Calcium 9.5 8.9 - 47.0 mg/dL   GFR, Estimated >96 >28 mL/min   Anion gap 13 5 - 15    Comment: Performed at [facility], [address] [place], [place] [address]    Blood Alcohol level:  Lab Results  Component Value Date   ETH <10 03/10/2020    Metabolic Disorder Labs: Lab Results  Component Value Date   HGBA1C 5.5 03/12/2020   MPG 111 03/12/2020   Lab Results  Component Value Date   PROLACTIN 10.4 03/12/2020   Lab Results  Component Value Date   CHOL 214 (H) 03/12/2020   TRIG 131 03/12/2020   HDL 35 (L) 03/12/2020   CHOLHDL 6.1 03/12/2020   VLDL 26 03/12/2020   LDLCALC 153 (H) 03/12/2020   LDLCALC 132 (H) 10/17/2019    Physical Findings: AIMS: Facial and Oral Movements Muscles of Facial Expression: None, normal Lips and Perioral Area: None, normal Jaw: None, normal Tongue:  None, normal,Extremity Movements Upper (arms, wrists, hands, fingers): None, normal Lower (legs, knees, ankles, toes): None, normal, Trunk Movements Neck, shoulders, hips: None, normal, Overall Severity Severity of abnormal movements (highest score  from questions above): None, normal Incapacitation due to abnormal movements: None, normal Patient's awareness of abnormal movements (rate only patient's report): No Awareness, Dental Status Current problems with teeth and/or dentures?: No Does patient usually wear dentures?: No  CIWA:  CIWA-Ar Total: 1 COWS:  COWS Total Score: 2  Musculoskeletal: Strength & Muscle Tone: within normal limits Gait & Station: normal Patient leans: N/A  Psychiatric Specialty Exam: Physical Exam Vitals and nursing note reviewed.  Constitutional:      Appearance: Normal appearance.  HENT:     Head: Normocephalic and atraumatic.  Cardiovascular:     Rate and Rhythm: Normal rate.  Pulmonary:     Effort: Pulmonary effort is normal. No respiratory distress.  Musculoskeletal:        General: Normal range of motion.     Cervical back: Normal range of motion.  Neurological:     Mental Status: He is alert.     Review of Systems  Constitutional: Negative.   Neurological: Negative.   Psychiatric/Behavioral: Positive for hallucinations. Negative for agitation, behavioral problems, sleep disturbance and suicidal ideas.    Blood pressure 127/79, pulse 93, temperature 97.8 F (36.6 C), temperature source Oral, resp. rate 18, height 6\' 1"  (1.854 m), weight 115.7 kg, SpO2 97 %.Body mass index is 33.64 kg/m.  General Appearance: Casual and Fairly Groomed  Eye Contact:  Fair  Speech:  Clear and Coherent and Normal Rate  Volume:  Normal  Mood:  Depressed  Affect:  Appropriate, Congruent and Depressed  Thought Process:  Coherent, Linear and Descriptions of Associations: Loose  Orientation:  Full (Time, Place, and Person)  Thought Content:  Hallucinations:  Auditory Visual  Suicidal Thoughts:  No  Homicidal Thoughts:  No  Memory:  Immediate;   Good Recent;   Good Remote;   Good  Judgement:  Fair  Insight:  Fair  Psychomotor Activity:  Normal  Concentration:  Concentration: Good and Attention Span: Good  Recall:  Good  Fund of Knowledge:  Good  Language:  Good  Akathisia:  No  Handed:  Right  AIMS (if indicated):     Assets:  Communication Skills Desire for Improvement Financial Resources/Insurance Housing Social Support Transportation Vocational/Educational  ADL's:  Intact  Cognition:  WNL  Sleep:  Number of Hours: 7.5     Treatment Plan Summary: Daily contact with patient to assess and evaluate symptoms and progress in treatment and Medication management   -Latuda 80 mg at bedtime for schizophrenia -Zocor 20 mg at bedtime for high cholesterol -Trazodone 50 mg at bedtime as needed for sleep -Cyclobenzaprine 5 mg 3 times daily as needed for muscle spasm. - Motrin 600 mg PO q6h PRN for moderate pain - Develop treatment plan to decrease risk of relapse upon discharge and the need for readmission.  -Encourage participation in th therapeutic milieu   , NP 03/14/2020, 6:22 PM   I have reviewed the note by NP, and discussed the plan of care.  I am in agreement with the assessment and plan.  03/16/2020, MD         Malik Craft, NP 03/14/2020, 6:22 PM

## 2020-03-14 NOTE — Progress Notes (Signed)
Pt observed pacing hall on initial approach. Appears restless with fair eye contact, logical speech. Remains preoccupied and paranoid about water being poisoned "I know I had Covid and that left me with this metallic taste but the water is not good. I think they did put something in it. Only the gingerale has been working for me". Noted in dayroom playing cards with male peer for most of this afternoon. Denies SI, HI, AVH and pain at this time. Reports his anxiety is 0/10 and depression is 0/10 "now if I don't go home tomorrow like she was telling me then my anxiety will really go high up. I miss my home and my family and I will really like to go home".  Emotional support and encouragement offered. PRN Advil given for bilateral leg pain 5/10 "from walking a lot on these floors". Q 15 minutes safety checks maintained without incident.  Pt receptive to care. Cooperative with unit routines. Tolerated all medications and meals well. Denies concerns at this time.

## 2020-03-15 DIAGNOSIS — F200 Paranoid schizophrenia: Secondary | ICD-10-CM | POA: Diagnosis not present

## 2020-03-15 NOTE — Progress Notes (Signed)
D: Patient denies SI/HI/AVH. Patient rated anxiety 2/10. Patient out in open areas. A:  Patient took scheduled medicine. Pt. Given 600mg  of advil for hip pain.  Support and encouragement provided Routine safety checks conducted every 15 minutes. Patient  Informed to notify staff with any concerns.   R: Safety maintained.

## 2020-03-15 NOTE — Progress Notes (Signed)
   03/15/20 0100  Psych Admission Type (Psych Patients Only)  Admission Status Involuntary  Psychosocial Assessment  Patient Complaints Anxiety  Eye Contact Fair  Facial Expression Anxious  Affect Appropriate to circumstance  Speech Logical/coherent  Interaction Assertive  Motor Activity Slow  Appearance/Hygiene Unremarkable  Behavior Characteristics Cooperative  Mood Suspicious;Preoccupied  Thought Process  Coherency Disorganized  Content Delusions;Preoccupation;Paranoia  Delusions Paranoid  Perception Derealization  Hallucination Olfactory;Visual  Judgment Limited  Confusion Moderate  Danger to Self  Current suicidal ideation? Denies  Danger to Others  Danger to Others None reported or observed

## 2020-03-15 NOTE — Progress Notes (Signed)
[facility] MD Progress Note  03/15/2020 4:21 PM Malik Blake  MRN:  [id number]   Subjective: Patient is a 54 year old male with a reported past psychiatric history significant for schizophrenia who apparently has been off his medications for at least 4 to 6 months. He was admitted secondary to auditory hallucinations and paranoia.  He has been staying with his mother for the past few weeks due to having COVID. He has a home in [place] where he will eventually return to live. He has a 6 year old daughter who is with her mother and he has some distress over not being able to see her much for the past year.   He denies any visual and auditory hallucinations today. He stopped taking his medications several months ago and has decompensated. He stated he has a metallic taste in his mouth and has a belief or suspicion that the water may be poisoned. He did state that he is drinking water today and rationally knows it is okay but still wonders if it is poison. He is able to hold a logical, linear conversation for several minutes but then his thoughts go to delusions and he has difficulty staying on topic. He is calm and cooperative. He is fixated on discharge today. He stated earlier he had a dizzy spell but after he drank water, he is feeling good. Blood pressure 127/79, pulse 93, temperature 97.8 F (36.6 C), temperature source Oral, resp. rate 18, height 6\' 1"  (1.854 m), weight 115.7 kg, SpO2 97 %.Body mass index is 33.64 kg/m. He slept 7.5 hours last night.  Principal Problem: Schizophrenia (HCC) Diagnosis: Principal Problem:   Schizophrenia (HCC) Active Problems:   Chronic back pain  Total Time spent with patient: 25 minutes  Past Psychiatric History: See admission H&P  Past Medical History:  Past Medical History:  Diagnosis Date  . High cholesterol   . Paranoid schizophrenia [facility])     Past Surgical History:  Procedure Laterality Date  . TONSILLECTOMY    . TONSILLECTOMY     Family History:   Family History  Problem Relation Age of Onset  . Cancer Mother        breast  . Cancer Father        prostate   Family Psychiatric  History: See admission H&P Social History:  Social History   Substance and Sexual Activity  Alcohol Use Yes   Comment: occasionally     Social History   Substance and Sexual Activity  Drug Use No    Social History   Socioeconomic History  . Marital status: Divorced    Spouse name: Not on file  . Number of children: Not on file  . Years of education: Not on file  . Highest education level: Not on file  Occupational History  . Not on file  Tobacco Use  . Smoking status: Never Smoker  . Smokeless tobacco: Never Used  Substance and Sexual Activity  . Alcohol use: Yes    Comment: occasionally  . Drug use: No  . Sexual activity: Not on file  Other Topics Concern  . Not on file  Social History Narrative  . Not on file   Social Determinants of Health   Financial Resource Strain:   . Difficulty of Paying Living Expenses: Not on file  Food Insecurity:   . Worried About [facility] in the Last Year: Not on file  . Ran Out of Food in the Last Year: Not on file  Transportation Needs:   .  Lack of Transportation (Medical): Not on file  . Lack of Transportation (Non-Medical): Not on file  Physical Activity:   . Days of Exercise per Week: Not on file  . Minutes of Exercise per Session: Not on file  Stress:   . Feeling of Stress : Not on file  Social Connections:   . Frequency of Communication with Friends and Family: Not on file  . Frequency of Social Gatherings with Friends and Family: Not on file  . Attends Religious Services: Not on file  . Active Member of Clubs or Organizations: Not on file  . Attends Banker Meetings: Not on file  . Marital Status: Not on file   Additional Social History:    Pain Medications: See MAr Prescriptions: See MAr Over the Counter: See MAR History of alcohol / drug use?: No  history of alcohol / drug abuse                    Sleep: Good  Appetite:  Good  Current Medications: Current Facility-Administered Medications  Medication Dose Route Frequency Provider Last Rate Last Admin  . cyclobenzaprine (FLEXERIL) tablet 5 mg  5 mg Oral TID PRN Antonieta Pert, MD      . ibuprofen (ADVIL) tablet 600 mg  600 mg Oral Q6H PRN Laveda Abbe, NP   600 mg at 03/15/20 1140  . lurasidone (LATUDA) tablet 80 mg  80 mg Oral QHS Mariel Craft, MD   80 mg at 03/14/20 2126  . simvastatin (ZOCOR) tablet 20 mg  20 mg Oral QHS Denzil Magnuson, NP   20 mg at 03/14/20 2126    Lab Results:  Results for orders placed or performed during the hospital encounter of 03/11/20 (from the past 48 hour(s))  Basic metabolic panel     Status: None   Collection Time: 03/14/20  9:53 AM  Result Value Ref Range   Sodium 136 135 - 145 mmol/L   Potassium 3.8 3.5 - 5.1 mmol/L   Chloride 99 98 - 111 mmol/L   CO2 24 22 - 32 mmol/L   Glucose, Bld 94 70 - 99 mg/dL    Comment: Glucose reference range applies only to samples taken after fasting for at least 8 hours.   BUN 12 6 - 20 mg/dL   Creatinine, Ser 1.61 0.61 - 1.24 mg/dL   Calcium 9.5 8.9 - 09.6 mg/dL   GFR, Estimated >04 >54 mL/min   Anion gap 13 5 - 15    Comment: Performed at [facility], [address], [place], [place] [address]    Blood Alcohol level:  Lab Results  Component Value Date   ETH <10 03/10/2020    Metabolic Disorder Labs: Lab Results  Component Value Date   HGBA1C 5.5 03/12/2020   MPG 111 03/12/2020   Lab Results  Component Value Date   PROLACTIN 10.4 03/12/2020   Lab Results  Component Value Date   CHOL 214 (H) 03/12/2020   TRIG 131 03/12/2020   HDL 35 (L) 03/12/2020   CHOLHDL 6.1 03/12/2020   VLDL 26 03/12/2020   LDLCALC 153 (H) 03/12/2020   LDLCALC 132 (H) 10/17/2019    Physical Findings: AIMS: Facial and Oral Movements Muscles of Facial Expression: None,  normal Lips and Perioral Area: None, normal Jaw: None, normal Tongue: None, normal,Extremity Movements Upper (arms, wrists, hands, fingers): None, normal Lower (legs, knees, ankles, toes): None, normal, Trunk Movements Neck, shoulders, hips: None, normal, Overall Severity Severity of  abnormal movements (highest score from questions above): None, normal Incapacitation due to abnormal movements: None, normal Patient's awareness of abnormal movements (rate only patient's report): No Awareness, Dental Status Current problems with teeth and/or dentures?: No Does patient usually wear dentures?: No  CIWA:  CIWA-Ar Total: 1 COWS:  COWS Total Score: 2  Musculoskeletal: Strength & Muscle Tone: within normal limits Gait & Station: normal Patient leans: N/A  Psychiatric Specialty Exam: Physical Exam Vitals and nursing note reviewed.  Constitutional:      Appearance: Normal appearance.  HENT:     Head: Normocephalic and atraumatic.  Cardiovascular:     Rate and Rhythm: Normal rate.  Pulmonary:     Effort: Pulmonary effort is normal. No respiratory distress.  Musculoskeletal:        General: Normal range of motion.     Cervical back: Normal range of motion.  Neurological:     Mental Status: He is alert and oriented to person, place, and time.     Review of Systems  Constitutional: Negative.   HENT: Negative.   Respiratory: Negative.   Gastrointestinal: Negative.   Neurological: Negative.   Psychiatric/Behavioral: Positive for dysphoric mood. Negative for agitation, behavioral problems, hallucinations, sleep disturbance and suicidal ideas.    Blood pressure 127/79, pulse 93, temperature 97.8 F (36.6 C), temperature source Oral, resp. rate 18, height 6\' 1"  (1.854 m), weight 115.7 kg, SpO2 97 %.Body mass index is 33.64 kg/m.  General Appearance: Casual and Fairly Groomed  Eye Contact:  Fair  Speech:  Clear and Coherent and Normal Rate  Volume:  Normal  Mood:  Depressed  Affect:   Appropriate, Congruent and Depressed  Thought Process:  Coherent, Linear and Descriptions of Associations: Loose  Orientation:  Full (Time, Place, and Person)  Thought Content:  Obsessions and Rumination  Suicidal Thoughts:  No  Homicidal Thoughts:  No  Memory:  Immediate;   Good Recent;   Good Remote;   Good  Judgement:  Fair  Insight:  Fair  Psychomotor Activity:  Normal  Concentration:  Concentration: Good and Attention Span: Good  Recall:  Good  Fund of Knowledge:  Good  Language:  Good  Akathisia:  No  Handed:  Right  AIMS (if indicated):     Assets:  Communication Skills Desire for Improvement Financial Resources/Insurance Housing Social Support Transportation Vocational/Educational  ADL's:  Intact  Cognition:  WNL  Sleep:  Number of Hours: 7.5   Assessment: Patient is a 54 year old male with a reported past psychiatric history significant for schizophrenia who apparently has been off his medications for at least 4 to 6 months. He was admitted secondary to auditory hallucinations and paranoia.  Diagnosis:  1. Schizophrenia 2. Hyperlipidemia  Pertinent findings today:  1. Denies SI or HI 2. Denies AVH 3. Improved mood and affect 4. Fixated on discharge 5. C/o metallic taste- paranoid about poison.    Treatment Plan Summary: Daily contact with patient to assess and evaluate symptoms and progress in treatment and Medication management   -Latuda 80 mg at bedtime for schizophrenia -Zocor 20 mg at bedtime for high cholesterol -Discontinue Trazodone 50 mg  -Cyclobenzaprine 5 mg 3 times daily as needed for muscle spasm. - Motrin 600 mg PO q6h PRN for moderate pain - Develop treatment plan to decrease risk of relapse upon discharge and the need for readmission.  -Encourage participation in th therapeutic milieu -Disposition in progress- possible discharge tomorrow.      41, MD 03/15/2020, 4:21 PM
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
## 2020-03-16 MED ORDER — LURASIDONE HCL 80 MG PO TABS
80.0000 mg | ORAL_TABLET | Freq: Every day | ORAL | 0 refills | Status: AC
Start: 2020-03-16 — End: ?

## 2020-03-16 NOTE — Progress Notes (Signed)
Recreation Therapy Notes  Date: 10.18.21 Time: 0930 Location: [address] Dayroom  Group Topic: Stress Management  Goal Area(s) Addresses:  Patient will identify positive stress management techniques. Patient will identify benefits of using stress management post d/c.  Intervention: Stress Management  Activity:  Meditation.  LRT played a meditation that focused on being resilient.  Patients were to listen and follow along as meditation played to engage in activity.    Education:  Stress Management, Discharge Planning.   Education Outcome: Acknowledges Education  Clinical Observations/Feedback: Pt did not attend group session.    Caroll Rancher, LRT/CTRS         Caroll Rancher A 03/16/2020 11:44 AM

## 2020-03-16 NOTE — Progress Notes (Signed)
  [facility] Adult Case Management Discharge Plan :  Will you be returning to the same living situation after discharge:  Yes,  Mother's home  At discharge, do you have transportation home?: Yes,  Mother  Do you have the ability to pay for your medications: Yes,  Family   Release of information consent forms completed and in the chart;  Patient's signature needed at discharge.  Patient to Follow up at:  Follow-up Information    [facility]. Go on 03/18/2020.   Why: You have a hospital follow up appointment on 03/18/20 at 9:00 am for therapy and medication management services.  This appointment will be held in person.   Contact information: [address] [place] [address] [phone]               Next level of care provider has access to [facility] Link:no  Safety Planning and Suicide Prevention discussed: Yes,  Mother and patient   Have you used any form of tobacco in the last 30 days? (Cigarettes, Smokeless Tobacco, Cigars, and/or Pipes): No  Has patient been referred to the Quitline?: N/A patient is not a smoker  Patient has been referred for addiction treatment: N/A  Aram Beecham, LCSWA 03/16/2020, 9:38 AM

## 2020-03-16 NOTE — Progress Notes (Signed)
Discharge:  Pt is caucasian male  of  54 y.o. , DOB 08/28/1965 , MRN  [id number]   Pt is alert and oriented X 4. Pt given AVS packet to include medication information update, future appointment information, and other resource information. Pt given all belongings and signature obtained. Pt discharged to private resident with family member. Transportation provided by family member.    Einar Crow. Melvyn Neth MSN, RN, [facility] [phone]

## 2020-03-16 NOTE — Discharge Summary (Signed)
Physician Discharge Summary Note  Patient:  Malik Blake is an 54 y.o., male MRN:  [id number] DOB:  May 30, 1966 Patient phone:  [phone] (home)  [place]   [address] [place] [id number],  Total Time spent with patient: 30 minutes  Date of Admission:  03/11/2020 Date of Discharge: 03/16/2020  Reason for Admission: Patient is a 54 year old male with a reported past psychiatric history significant for schizophrenia who presented to the [facility] emergency department on 03/05/2020 with feelings that "someone is trying to poison my food". He stated at that time that he felt as though he had stuff running through his veins that should not be there. He stated that both his arms and legs feel like there were toxins running in them. Shortly after his arrival he told him that he wanted to leave and that he was feeling better. He denied any suicidal or homicidal ideation. Apparently he was released. The patient then presented again on 03/10/2020 under involuntary commitment. He had a reported history of paranoid schizophrenia, and was not taking his medications. He was admitted for stabilization and further evaluation.  Principal Problem: Schizophrenia [facility](HCC) Discharge Diagnoses: Principal Problem:   Schizophrenia (HCC) Active Problems:   Chronic back pain   Past Psychiatric History: [facility]  He was last seen there approximately 6 months ago.  He stopped his medications at approximately that time as well.  He told me that the doctor told him that "to stop the medicine".  He was taking an unspecified dosage of Latuda at that time.  [facility]  He also has been previously treated with Risperdal as well as haloperidol.  Past Medical History:  Past Medical History:  Diagnosis Date  . High cholesterol   . Paranoid schizophrenia [facility](HCC)      Past Surgical History:  Procedure Laterality Date  . TONSILLECTOMY    . TONSILLECTOMY     Family History:  Family History  Problem Relation Age of Onset  . Cancer Mother        breast  . Cancer Father        prostate   Family Psychiatric  History: Noncontributory Social History:  Social History   Substance and Sexual Activity  Alcohol Use Yes   Comment: occasionally     Social History   Substance and Sexual Activity  Drug Use No    Social History   Socioeconomic History  . Marital status: Divorced    Spouse name: Not on file  . Number of children: Not on file  . Years of education: Not on file  . Highest education level: Not on file  Occupational History  . Not on file  Tobacco Use  . Smoking status: Never Smoker  . Smokeless tobacco: Never Used  Substance and Sexual Activity  . Alcohol use: Yes    Comment: occasionally  . Drug use: No  . Sexual activity: Not on file  Other Topics Concern  . Not on file  Social History Narrative  . Not on file   Social Determinants of Health   Financial Resource Strain:   . Difficulty of Paying Living Expenses: Not on file  Food Insecurity:   . Worried About Programme researcher, broadcasting/film/videounning Out of Food in the Last Year: Not on file  . Ran Out of Food in the Last Year: Not on file  Transportation Needs:   . Lack of Transportation (  Medical): Not on file  . Lack of Transportation (Non-Medical): Not on file  Physical Activity:   . Days of Exercise per Week: Not on file  . Minutes of Exercise per Session: Not on file  Stress:   . Feeling of Stress : Not on file  Social Connections:   . Frequency of Communication with Friends and Family: Not on file  . Frequency of Social Gatherings with Friends and Family: Not on file  . Attends Religious Services: Not on file  . Active Member of Clubs or Organizations: Not on file  . Attends Banker Meetings: Not on file  . Marital Status: Not on file    Hospital Course:  Patient did well  on Latuda in past. His outpatient psychiatrist stopped Latuda and changed it to Zyprexa PRN. He was restarted on Latuda 40 mg p.o. daily and 60 mg p.o. nightly. Provided with hydroxyzine for anxiety and trazodone for sleep PRN.  He felt better after getting back on Latuda and it was made 80 mg at bedtime.  On the day of discharge he denies suicidal ideations, homicidal ideations, auditory or visual hallucinations. He presents with better mood and affect. His mother was called and she also agrees with improvement in patient's overall health. She will be picking him up. He is appreciative of all the help and is leaving the unit in good spirits.   Physical Findings: AIMS: Facial and Oral Movements Muscles of Facial Expression: None, normal Lips and Perioral Area: None, normal Jaw: None, normal Tongue: None, normal,Extremity Movements Upper (arms, wrists, hands, fingers): None, normal Lower (legs, knees, ankles, toes): None, normal, Trunk Movements Neck, shoulders, hips: None, normal, Overall Severity Severity of abnormal movements (highest score from questions above): None, normal Incapacitation due to abnormal movements: None, normal Patient's awareness of abnormal movements (rate only patient's report): No Awareness, Dental Status Current problems with teeth and/or dentures?: No Does patient usually wear dentures?: No  CIWA:  CIWA-Ar Total: 1 COWS:  COWS Total Score: 2  Musculoskeletal: Strength & Muscle Tone: within normal limits Gait & Station: normal Patient leans: N/A  Psychiatric Specialty Exam: Physical Exam Vitals and nursing note reviewed.  Constitutional:      Appearance: Normal appearance.  HENT:     Head: Normocephalic and atraumatic.  Cardiovascular:     Rate and Rhythm: Normal rate.  Pulmonary:     Effort: Pulmonary effort is normal. No respiratory distress.  Musculoskeletal:        General: Normal range of motion.     Cervical back: Normal range of motion.   Neurological:     Mental Status: He is alert and oriented to person, place, and time.  Psychiatric:        Attention and Perception: Attention and perception normal.        Mood and Affect: Mood is anxious.        Speech: Speech normal.        Behavior: Behavior is cooperative.        Cognition and Memory: Cognition and memory normal.     Review of Systems  Constitutional: Negative.   HENT: Negative.   Respiratory: Negative.   Gastrointestinal: Negative.   Neurological: Negative.   Psychiatric/Behavioral: Negative for agitation, behavioral problems, dysphoric mood, hallucinations, sleep disturbance and suicidal ideas.    Blood pressure 121/79, pulse 71, temperature 97.8 F (36.6 C), temperature source Oral, resp. rate 18, height 6\' 1"  (1.854 m), weight 115.7 kg, SpO2 97 %.Body mass index is 33.64  kg/m.  General Appearance: Casual  Eye Contact:  Good  Speech:  Normal Rate  Volume:  Normal  Mood:  Euthymic  Affect:  Appropriate  Thought Process:  Linear and Descriptions of Associations: Intact  Orientation:  Full (Time, Place, and Person)  Thought Content:  Logical  Suicidal Thoughts:  No  Homicidal Thoughts:  No  Memory:  Immediate;   Good Recent;   Good Remote;   Good  Judgement:  Intact  Insight:  Good  Psychomotor Activity:  Normal  Concentration:  Concentration: Good and Attention Span: Good  Recall:  Good  Fund of Knowledge:  Good  Language:  Good  Akathisia:  Negative  Handed:  Right  AIMS (if indicated):     Assets:  Communication Skills Desire for Improvement Housing Resilience Social Support  ADL's:  Intact  Cognition:  WNL  Sleep:  Number of Hours: 7.5     Have you used any form of tobacco in the last 30 days? (Cigarettes, Smokeless Tobacco, Cigars, and/or Pipes): No  Has this patient used any form of tobacco in the last 30 days? (Cigarettes, Smokeless Tobacco, Cigars, and/or Pipes) Yes, N/A  Blood Alcohol level:  Lab Results  Component Value  Date   ETH <10 03/10/2020    Metabolic Disorder Labs:  Lab Results  Component Value Date   HGBA1C 5.5 03/12/2020   MPG 111 03/12/2020   Lab Results  Component Value Date   PROLACTIN 10.4 03/12/2020   Lab Results  Component Value Date   CHOL 214 (H) 03/12/2020   TRIG 131 03/12/2020   HDL 35 (L) 03/12/2020   CHOLHDL 6.1 03/12/2020   VLDL 26 03/12/2020   LDLCALC 153 (H) 03/12/2020   LDLCALC 132 (H) 10/17/2019    See Psychiatric Specialty Exam and Suicide Risk Assessment completed by Attending Physician prior to discharge.  Discharge destination:  Home  Is patient on multiple antipsychotic therapies at discharge:  No   Has Patient had three or more failed trials of antipsychotic monotherapy by history:  No  Recommended Plan for Multiple Antipsychotic Therapies: NA  Discharge Instructions    Discharge instructions   Complete by: As directed    Activity as tolerated. Diet as recommended by primary care physician. Keep all scheduled follow-up appointments as recommended.   Discharge patient   Complete by: As directed    Discharge disposition: 01-Home or Self Care   Discharge patient date: 03/16/2020     Allergies as of 03/16/2020   No Known Allergies     Medication List    STOP taking these medications   OLANZapine 5 MG tablet Commonly known as: ZYPREXA     TAKE these medications     Indication  FISH OIL PO Take 2 tablets by mouth at bedtime.  Indication: Nutrition   ibuprofen 200 MG tablet Commonly known as: ADVIL Take 400 mg by mouth every 6 (six) hours as needed.  Indication: Pain   lurasidone 80 MG Tabs tablet Commonly known as: LATUDA Take 1 tablet (80 mg total) by mouth at bedtime. What changed:   medication strength  how much to take  when to take this  Indication: Depressive Phase of Manic-Depression   simvastatin 20 MG tablet Commonly known as: ZOCOR Take 1 tablet (20 mg total) by mouth at bedtime.  Indication: High Amount of Fats in  the Blood       Follow-up Information    [facility]. Go on 03/18/2020.   Why: You have a hospital follow  up appointment on 03/18/20 at 9:00 am for therapy and medication management services.  This appointment will be held in person.   Contact information: [address] [place] [address] [phone]               Follow-up recommendations:  Activity:  Normal Diet:  Heart healthy diet  Comments:  Prescriptions given at discharge.Patient agreeable to plan. Given opportunity to ask questions. Appears to feel comfortable with discharge denies any current suicidal or homicidal thought. Patient is also instructed prior to discharge to: Take all medications as prescribed by his mental healthcare provider. Report any adverse effects and or reactions from the medicines to his outpatient provider promptly. Patient has been instructed & cautioned: To not engage in alcohol and or illegal drug use while on prescription medicines. In the event of worsening symptoms, patient is instructed to call the crisis hotline, 911 and or go to the nearest ED for appropriate evaluation and treatment of symptoms. To follow-up with his primary care provider for your other medical issues, concerns and or health care needs  Signed: Arnoldo Lenis, MD 03/16/2020, 10:52 AM

## 2020-03-16 NOTE — Progress Notes (Signed)
   03/16/20 1000  Psych Admission Type (Psych Patients Only)  Admission Status Involuntary  Psychosocial Assessment  Patient Complaints Irritability  Eye Contact Fair  Facial Expression Flat  Affect Apathetic  Speech Logical/coherent  Interaction Cautious;Demanding  Motor Activity Pacing  Appearance/Hygiene Unremarkable  Behavior Characteristics Cooperative  Mood Labile  Thought Process  Coherency WDL  Content WDL  Delusions None reported or observed  Perception WDL  Hallucination None reported or observed  Judgment Poor  Confusion None  Danger to Self  Current suicidal ideation? Denies

## 2020-03-16 NOTE — Progress Notes (Signed)
[place] NOVEL CORONAVIRUS (COVID-19) DAILY CHECK-OFF SYMPTOMS - answer yes or no to each - every day NO YES  Have you had a fever in the past 24 hours?  . Fever (Temp > 37.80C / 100F) X   Have you had any of these symptoms in the past 24 hours? . New Cough .  Sore Throat  .  Shortness of Breath .  Difficulty Breathing .  Unexplained Body Aches   X   Have you had any one of these symptoms in the past 24 hours not related to allergies?   . Runny Nose .  Nasal Congestion .  Sneezing   X   If you have had runny nose, nasal congestion, sneezing in the past 24 hours, has it worsened?  X   EXPOSURES - check yes or no X   Have you traveled outside the state in the past 14 days?  X   Have you been in contact with someone with a confirmed diagnosis of COVID-19 or PUI in the past 14 days without wearing appropriate PPE?  X   Have you been living in the same home as a person with confirmed diagnosis of COVID-19 or a PUI (household contact)?    X   Have you been diagnosed with COVID-19?    X              What to do next: Answered NO to all: Answered YES to anything:   Proceed with unit schedule Follow the [facility] Inpatient Flowsheet.   

## 2020-03-16 NOTE — [facility] Suicide Risk Assessment (Addendum)
[facility] Discharge Suicide Risk Assessment   Principal Problem: Schizophrenia [facility]) Discharge Diagnoses: Principal Problem:   Schizophrenia (HCC) Active Problems:   Chronic back pain   Total Time spent with patient: 15 minutes  Musculoskeletal: Strength & Muscle Tone: within normal limits Gait & Station: normal Patient leans: N/A  Psychiatric Specialty Exam: Review of Systems  Constitutional: Negative for activity change and appetite change.  HENT: Negative for congestion.   Eyes: Negative for redness.  Respiratory: Negative for shortness of breath.   Cardiovascular: Negative for chest pain.  Musculoskeletal: Positive for back pain.       Chronic back pain    Blood pressure 121/79, pulse 71, temperature 97.8 F (36.6 C), temperature source Oral, resp. rate 18, height 6\' 1"  (1.854 m), weight 115.7 kg, SpO2 97 %.Body mass index is 33.64 kg/m.  General Appearance: Casual and Fairly Groomed  ::  Fair  Speech:  Clear and Coherent and Normal Rate409  Volume:  Normal  Mood:  "fine"  Affect:  Appropriate, Congruent and euthymic  Thought Process:  Coherent and Linear  Orientation:  Full (Time, Place, and Person)  Thought Content:  WDL and Logical  Suicidal Thoughts:  denies  Homicidal Thoughts:  denies  Memory:  Immediate;   Fair Recent;   Fair  Judgement:  Fair  Insight:  Fair  Psychomotor Activity:  Normal  Concentration:  Fair  Recall:  [phone] of Knowledge:Fair  Language: Good  Akathisia:  No  Handed:  Right  AIMS (if indicated):     Assets:  Communication Skills Desire for Improvement Social Support  Sleep:  Number of Hours: 7.5  Cognition: WNL  ADL's:  Intact   Mental Status Per Nursing Assessment::   On Admission:  NA  Demographic Factors:  Male  Loss Factors:  NA  Historical Factors: medication on compliance  Risk Reduction Factors:   Sense of responsibility to family, Positive social support and Positive coping skills or problem  solving skills  Continued Clinical Symptoms:  Previous Psychiatric Diagnoses and Treatments Medical Diagnoses and Treatments/Surgeries  Cognitive Features That Contribute To Risk:  None    Suicide Risk:  Minimal: No identifiable suicidal ideation.  Patients presenting with no risk factors but with morbid ruminations; may be classified as minimal risk based on the severity of the depressive symptoms   Follow-up Information    [facility]. Go on 03/18/2020.   Why: You have a hospital follow up appointment on 03/18/20 at 9:00 am for therapy and medication management services.  This appointment will be held in person.   Contact information: [address] [place] [phone]               Plan Of Care/Follow-up recommendations:  Activity:  as tolerted Diet:  regular Other:      Patient is instructed prior to discharge to: Take all medications as prescribed by his/her mental healthcare provider. Report any adverse effects and or reactions from the medicines to his/her outpatient provider promptly. Patient has been instructed & cautioned: To not engage in alcohol and or illegal drug use while on prescription medicines. In the event of worsening symptoms, patient is instructed to call the crisis hotline, 911 and or go to the nearest ED for appropriate evaluation and treatment of symptoms. To follow-up with his/her primary care provider for your other medical issues, concerns and or health care needs.     [phone], MD 03/16/2020, 11:45 AM

## 2021-11-14 IMAGING — CT CT ANGIO HEAD
1 of 17 series · 2 of 34 positions shown · IV contrast (omnipaque)
Comparison: CT head 08/04/2004

CLINICAL DATA: Dizziness.  Throbbing left neck

EXAM:
CT ANGIOGRAPHY HEAD AND NECK
TECHNIQUE: Multidetector CT imaging of the head and neck was performed using
the standard protocol during bolus administration of intravenous
contrast. Multiplanar CT image reconstructions and MIPs were
obtained to evaluate the vascular anatomy. Carotid stenosis
measurements (when applicable) are obtained utilizing NASCET
criteria, using the distal internal carotid diameter as the
denominator.
CONTRAST:  75mL OMNIPAQUE IOHEXOL 350 MG/ML SOLN

[Series 12: cta head & neck · axial · 0.42mm/px · z∈[+1216,+1347]mm · 2 of 853 slices shown]
[im 285/853  soft-tissue]
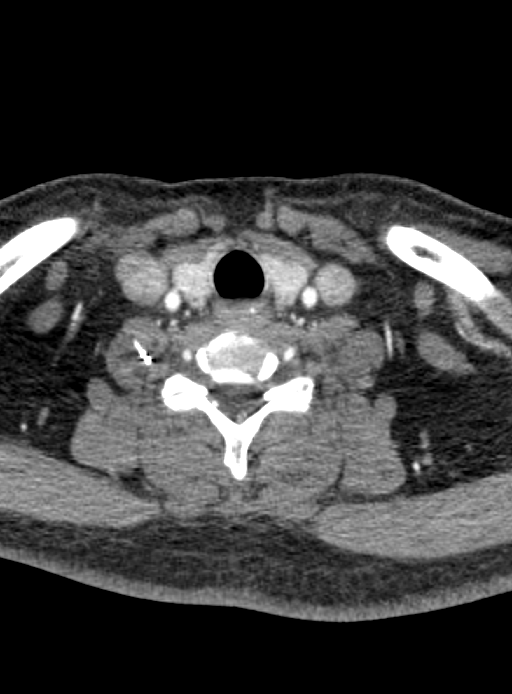
[im 569/853  bone]
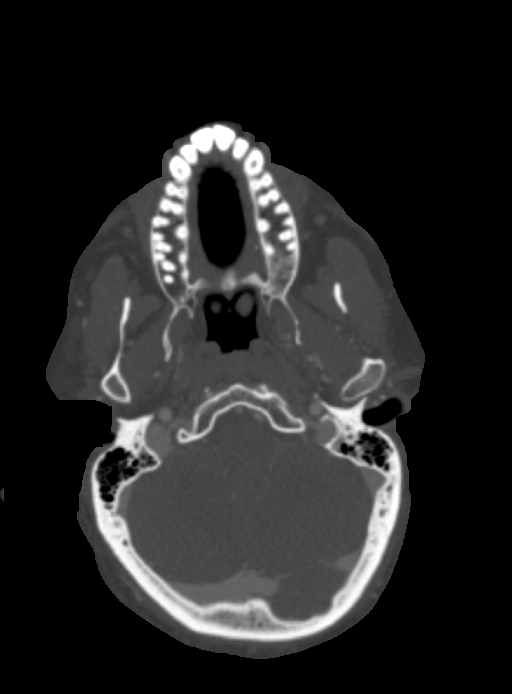

[2 of 34 positions shown; findings below may reference images not displayed]

FINDINGS: CT HEAD FINDINGS

Brain: No evidence of acute infarction, hemorrhage, hydrocephalus,
extra-axial collection or mass lesion/mass effect.

Vascular: Negative for hyperdense vessel

Skull: Negative

Sinuses: Negative

Orbits: Negative

Review of the MIP images confirms the above findings

CTA NECK FINDINGS

Aortic arch: Standard branching. Imaged portion shows no evidence of
aneurysm or dissection. No significant stenosis of the major arch
vessel origins.

Right carotid system: Normal right carotid. Negative for
atherosclerotic disease or stenosis

Left carotid system: Normal left carotid. Negative for
atherosclerotic disease or stenosis.

Vertebral arteries: Both vertebral arteries widely patent and normal
appearing.

Skeleton: Negative

Other neck: Negative

Upper chest: Negative

Review of the MIP images confirms the above findings

CTA HEAD FINDINGS

Anterior circulation: Cavernous carotid normal bilaterally. Anterior
and middle cerebral arteries normal bilaterally without stenosis or
large vessel occlusion. No vascular malformation.

Posterior circulation: Both vertebral arteries patent to the
basilar. PICA patent bilaterally. Basilar widely patent. AICA,
superior cerebellar, posterior cerebral arteries patent bilaterally
without stenosis.

Venous sinuses: Normal enhancement

Anatomic variants: None

Review of the MIP images confirms the above findings
IMPRESSION: Negative CT head

Negative CTA head and neck.

## 2022-11-09 NOTE — Congregational Nurse Program (Signed)
Connected patient with information and steps to connect to [facility] and how to get his two medications transferred correctly with his providers/prescribers  In addition reviewed and re-educated patient on all services that he has access by way of [facility] Un[facility] program.

## 2022-12-28 ENCOUNTER — Telehealth: Payer: Self-pay

## 2022-12-28 NOTE — Telephone Encounter (Signed)
Attempted wellness call and follow up to [facility] client.  PCP [facility] and next appointment is 02/09/23  No answer, left voicemail requesting return call at his convenience.    Francee Nodal RN [employer]

## 2023-06-29 ENCOUNTER — Telehealth: Payer: Self-pay

## 2023-06-29 NOTE — Telephone Encounter (Signed)
Attempted wellness call to [facility] client, no answer, left message requesting a return call. His PCP is listed as [facility], his last visit was 03/28/23 no future appointment showing. He was mailed a renewal reminder on 06/05/23 as his [facility] will expire 08/16/23   Francee Nodal RN [facility]

## 2023-09-18 NOTE — Congregational Nurse Program (Signed)
 Wellness check-in call was completed with patient in person per patient completing his [facility] Program Renewal on today (4.17.25)  Pt  denies any immediate socio determinant health needs as it relates to accessing medical care, medications, transportation and/or behavioral health needs.   Pt states he has been maintaining his appointments with his current PCP at the [facility] and is continues to be connected to behavioral health provider at [facility] .   States he is continues to be please with his care overall.   States his  last PCP follow up was on 4.16.25 with a next appt scheduled for 12/12/2023.  States he continues to take his prescribed meds as directed with no current issues.    Plan Pt was advised to follow up as needed for any info related to medical or social determinant needs and was was provided with my contact information.   2.  Reviewed the "Get Care Now" steps with pt on utilization of PCP versus the emergency room; with reminder that the local urgent cares are an option if he feels he must receive medical care on that day unless there is a true emergency for him to attend the hospital  Pt stated she understood the information shared and call then ended
